# Patient Record
Sex: Male | Born: 1950 | Race: White | Hispanic: No | Marital: Married | Smoking: Never smoker
Health system: Southern US, Community
[De-identification: ages and names within clinical notes are randomized; demographics above are authoritative.]

## PROBLEM LIST (undated history)

## (undated) DIAGNOSIS — J029 Acute pharyngitis, unspecified: Secondary | ICD-10-CM

## (undated) DIAGNOSIS — M2550 Pain in unspecified joint: Secondary | ICD-10-CM

## (undated) DIAGNOSIS — R509 Fever, unspecified: Secondary | ICD-10-CM

## (undated) DIAGNOSIS — R51 Headache: Secondary | ICD-10-CM

## (undated) DIAGNOSIS — M25562 Pain in left knee: Secondary | ICD-10-CM

## (undated) DIAGNOSIS — D72819 Decreased white blood cell count, unspecified: Secondary | ICD-10-CM

## (undated) DIAGNOSIS — M25561 Pain in right knee: Secondary | ICD-10-CM

## (undated) DIAGNOSIS — R740 Nonspecific elevation of levels of transaminase and lactic acid dehydrogenase [LDH]: Secondary | ICD-10-CM

## (undated) HISTORY — DX: Pain in left knee: M25.562

## (undated) HISTORY — DX: Acute pharyngitis, unspecified: J02.9

## (undated) HISTORY — DX: Decreased white blood cell count, unspecified: D72.819

## (undated) HISTORY — DX: Nonspecific elevation of levels of transaminase and lactic acid dehydrogenase (ldh): R74.0

## (undated) HISTORY — DX: Fever, unspecified: R50.9

## (undated) HISTORY — DX: Pain in unspecified joint: M25.50

## (undated) HISTORY — DX: Headache: R51

## (undated) HISTORY — DX: Pain in right knee: M25.561

---

## 1999-02-23 ENCOUNTER — Ambulatory Visit (HOSPITAL_BASED_OUTPATIENT_CLINIC_OR_DEPARTMENT_OTHER): Admission: RE | Admit: 1999-02-23 | Discharge: 1999-02-23 | Payer: Self-pay | Admitting: Surgery

## 2002-02-13 ENCOUNTER — Ambulatory Visit (HOSPITAL_COMMUNITY): Admission: RE | Admit: 2002-02-13 | Discharge: 2002-02-13 | Payer: Self-pay | Admitting: Gastroenterology

## 2011-03-08 DIAGNOSIS — IMO0002 Reserved for concepts with insufficient information to code with codable children: Secondary | ICD-10-CM | POA: Insufficient documentation

## 2011-03-08 DIAGNOSIS — H40009 Preglaucoma, unspecified, unspecified eye: Secondary | ICD-10-CM | POA: Insufficient documentation

## 2011-08-20 DIAGNOSIS — B1910 Unspecified viral hepatitis B without hepatic coma: Secondary | ICD-10-CM | POA: Insufficient documentation

## 2012-11-26 ENCOUNTER — Encounter (INDEPENDENT_AMBULATORY_CARE_PROVIDER_SITE_OTHER): Payer: Self-pay | Admitting: General Surgery

## 2018-01-23 DIAGNOSIS — H2513 Age-related nuclear cataract, bilateral: Secondary | ICD-10-CM | POA: Diagnosis not present

## 2018-01-23 DIAGNOSIS — H40013 Open angle with borderline findings, low risk, bilateral: Secondary | ICD-10-CM | POA: Diagnosis not present

## 2018-01-30 DIAGNOSIS — R002 Palpitations: Secondary | ICD-10-CM | POA: Diagnosis not present

## 2018-01-30 DIAGNOSIS — Z23 Encounter for immunization: Secondary | ICD-10-CM | POA: Diagnosis not present

## 2018-01-30 DIAGNOSIS — Z0000 Encounter for general adult medical examination without abnormal findings: Secondary | ICD-10-CM | POA: Diagnosis not present

## 2018-01-30 DIAGNOSIS — D696 Thrombocytopenia, unspecified: Secondary | ICD-10-CM | POA: Diagnosis not present

## 2018-04-14 DIAGNOSIS — R509 Fever, unspecified: Secondary | ICD-10-CM | POA: Diagnosis not present

## 2018-04-21 DIAGNOSIS — R509 Fever, unspecified: Secondary | ICD-10-CM | POA: Diagnosis not present

## 2018-04-24 DIAGNOSIS — R51 Headache: Secondary | ICD-10-CM | POA: Diagnosis not present

## 2018-04-24 DIAGNOSIS — R509 Fever, unspecified: Secondary | ICD-10-CM | POA: Diagnosis not present

## 2018-04-28 DIAGNOSIS — R509 Fever, unspecified: Secondary | ICD-10-CM | POA: Diagnosis not present

## 2018-04-28 DIAGNOSIS — R51 Headache: Secondary | ICD-10-CM | POA: Diagnosis not present

## 2018-04-30 ENCOUNTER — Ambulatory Visit
Admission: RE | Admit: 2018-04-30 | Discharge: 2018-04-30 | Disposition: A | Discharge status: Home / Self Care | Payer: Medicare Other | Source: Ambulatory Visit | Attending: Internal Medicine | Admitting: Internal Medicine

## 2018-04-30 ENCOUNTER — Other Ambulatory Visit: Payer: Self-pay | Admitting: Internal Medicine

## 2018-04-30 DIAGNOSIS — D72819 Decreased white blood cell count, unspecified: Secondary | ICD-10-CM

## 2018-04-30 DIAGNOSIS — R509 Fever, unspecified: Secondary | ICD-10-CM | POA: Diagnosis not present

## 2018-04-30 DIAGNOSIS — D696 Thrombocytopenia, unspecified: Secondary | ICD-10-CM | POA: Diagnosis not present

## 2018-04-30 DIAGNOSIS — M2550 Pain in unspecified joint: Secondary | ICD-10-CM | POA: Diagnosis not present

## 2018-04-30 DIAGNOSIS — R740 Nonspecific elevation of levels of transaminase and lactic acid dehydrogenase [LDH]: Secondary | ICD-10-CM | POA: Diagnosis not present

## 2018-05-01 NOTE — Progress Notes (Signed)
  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 Robert Gates at [facility] for evaluation and management of Leukopenia. The pt reports that he is doing well overall.  The pt reports that 3 weeks ago he developed a gradual onset of fevers, significant headaches, malaise, fatigue, body aches, joint pains mostly in wrists and knees but denies redness. He has had muscular pains. Headaches described with pressure in occipital and temporal with throbbing and associated with onset of fevers, denies neck stiffness. He notes that these symptoms worsened over the first two weeks,and began Doxycycline with his PCP Dr. Gates. His fevers peaked at 102, and sustained for at least 5-6 days.   He notes that he began feeling better about one week ago, endorsing feeling gradually a little better each day this week. His temperature has been 99.1 the last 3 nights, no Tylenol, denies night sweats. Fatigue and muscle soreness/weakness is improving. The pt endorses feeling he is 90% back to his baseline. Joint pains described as more persistent with wrist tenderness, denies knee swelling. The pt denies diarrhea. Had nausea when he felt he was at his worst, with vomiting the morning he began Doxycycline. Denies lung or upper respiratory symptoms, endorses injected throat with vesicles which have improved. Seasonal nasal congestion with blood in morning, clears as day progresses. The pt notes that his throat has improved and is no longer injected.  The pt had been previously been golfing and denies any concern for abnormal insect bites or rashes. 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 Denies  international travel in the last 6 months. The pt denies pain along the spine or neck pain. The pt notes that he has lost about 5 pounds over the last couple weeks and had a low appetite, which is now improving.  The pt notes that his PLT were seen to be in low 100ks about 4-5 years ago, and have been persistently low. He notes his PLT last year were about 80k.   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 Notes that his liver tests have been normal for several years, has not had PCR testing.  Most recent lab results (04/30/18) of CBC w/diff is as follows: all values are WNL except for WBC at 1.4k, HCT at 38.3, PLT at 63k, ANC at 900, Lymphs at 600, Monocytes at 100. 04/30/18 LDH at 423. LDH at 368 on 04/25/18. 04/28/18 Sed Rate at 17. 04/26/18 SARS-Co-V-x 2 Negative 04/25/18 Hepatic Panel revealed all values WNL except for ALT at 71, AST at 67 EBV IgM neg, CMV IgM neg  On review of systems, pt reports improving fevers, improving headaches, resolved nausea, resolved throat soreness, slowly improving joint pains, knee tenderness, and denies changes in vision, cough, skin rashes, night sweats, noticing any new lumps or bumps, pain along the spine, abdominal pains, lower abdominal pains, leg swelling, testicular pain or swelling, neck pain, diarrhea, and any other symptoms.   On PMHx the pt reports mixed hyperlipidemia, Hep B. 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 On Family Hx the pt reports sister with CML, denies other bleeding, clotting,   or blood disorders.  MEDICAL HISTORY:  Past Medical History:  Diagnosis Date  . Arthralgia of both knees 05/06/2018  . Arthritic-like pain 05/06/2018  . FUO (fever of unknown origin) 05/06/2018  . Headache 05/06/2018  . Leukopenia 05/06/2018  . Pharyngitis 05/06/2018  . Transaminitis 05/06/2018  . Transaminitis 05/06/2018  . Patient Active Problem List   Diagnosis Date  Noted  . FUO (fever of unknown origin) 05/06/2018  . Elevated LDH 05/06/2018  . Transaminitis 05/06/2018  . Leukopenia 05/06/2018  . Headache 05/06/2018  . Arthritic-like pain 05/06/2018  . Arthralgia of both knees 05/06/2018  . Pharyngitis 05/06/2018  . Hepatitis B 08/20/2011  . Glaucoma suspect 03/08/2011  . Hypertropia 03/08/2011     SURGICAL HISTORY: No past surgical history on file.  SOCIAL HISTORY: Social History   Socioeconomic History  . Marital status: Married    Spouse name: Not on file  . Number of children: Not on file  . Years of education: Not on file  . Highest education level: Not on file  Occupational History  . Not on file  Social Needs  . Financial resource strain: Not on file  . Food insecurity:    Worry: Not on file    Inability: Not on file  . Transportation needs:    Medical: Not on file    Non-medical: Not on file  Tobacco Use  . Smoking status: Never Smoker  . Smokeless tobacco: Never Used  Substance and Sexual Activity  . Alcohol use: Yes    Comment: occ  . Drug use: Never  . Sexual activity: Not on file  Lifestyle  . Physical activity:    Days per week: Not on file    Minutes per session: Not on file  . Stress: Not on file  Relationships  . Social connections:    Talks on phone: Not on file    Gets together: Not on file    Attends religious service: Not on file    Active member of club or organization: Not on file    Attends meetings of clubs or organizations: Not on file    Relationship status: Not on file  . Intimate partner violence:    Fear of current or ex partner: Not on file    Emotionally abused: Not on file    Physically abused: Not on file    Forced sexual activity: Not on file  Other Topics Concern  . Not on file  Social History Narrative  . Not on file    FAMILY HISTORY: No family history on file.  ALLERGIES:  has no allergies on file.  MEDICATIONS:  Current Outpatient Medications  Medication Sig  Dispense Refill  . acetaminophen (TYLENOL) 160 MG/5ML elixir Take 15 mg/kg by mouth every 4 (four) hours as needed for fever.    . traMADol (ULTRAM) 50 MG tablet      No current facility-administered medications for this visit.     REVIEW OF SYSTEMS:    10 Point review of Systems was done is negative except as noted above.  PHYSICAL EXAMINATION:  . Vitals:   05/02/18 1231  BP: 112/73  Pulse: 84  Resp: 18  Temp: 98 F (36.7 C)  SpO2: 98%   Filed Weights   05/02/18 1231  Weight: 165 lb 6.4 oz (75 kg)   .Body mass index is 21.24 kg/m.  GENERAL:alert, in no acute distress and comfortable SKIN: no acute rashes, no significant lesions EYES: conjunctiva are pink and non-injected, sclera anicteric OROPHARYNX: (+)  One small pustule and some pharyngeal congestion without exudates. NECK: supple, no JVD LYMPH:  no palpable lymphadenopathy in the cervical, axillary or inguinal regions LUNGS: clear to auscultation b/l with normal respiratory effort HEART: regular rate & rhythm ABDOMEN:  normoactive bowel sounds , non tender, not distended. Extremity: no pedal edema PSYCH: alert & oriented x 3 with fluent speech NEURO: no focal motor/sensory deficits  LABORATORY DATA:  I have reviewed the data as listed Component     Latest Ref Rng & Units 05/02/2018  WBC     4.0 - 10.5 K/uL 1.3 (L)  RBC     4.22 - 5.81 MIL/uL 4.16 (L)  Hemoglobin     13.0 - 17.0 g/dL 12.4 (L)  HCT     39.0 - 52.0 % 37.3 (L)  MCV     80.0 - 100.0 fL 89.7  MCH     26.0 - 34.0 pg 29.8  MCHC     30.0 - 36.0 g/dL 33.2  RDW     11.5 - 15.5 % 13.6  Platelets     150 - 400 K/uL 89 (L)  nRBC     0.0 - 0.2 % 0.0  Neutrophils     % 64  NEUT#     1.7 - 7.7 K/uL 0.8 (L)  Lymphocytes     % 29  Lymphocyte #     0.7 - 4.0 K/uL 0.4 (L)  Monocytes Relative     % 7  Monocyte #     0.1 - 1.0 K/uL 0.1  Eosinophil     % 0  Eosinophils Absolute     0.0 - 0.5 K/uL 0.0  Basophil     % 0  Basophils  Absolute     0.0 - 0.1 K/uL 0.0  WBC Morphology      FEW VARIANT LYMPHS, RARE PLASMACYTOID  Immature Granulocytes     % 0  Abs Immature Granulocytes     0.00 - 0.07 K/uL 0.00  Rouleaux      PRESENT  Sodium     135 - 145 mmol/L 138  Potassium     3.5 - 5.1 mmol/L 4.1  Chloride     98 - 111 mmol/L 102  CO2     22 - 32 mmol/L 27  Glucose     70 - 99 mg/dL 96  BUN     8 - 23 mg/dL 16  Creatinine     0.61 - 1.24 mg/dL 0.95  Calcium     8.9 - 10.3 mg/dL 8.6 (L)  Total Protein     6.5 - 8.1 g/dL 6.8  Albumin     3.5 - 5.0 g/dL 3.3 (L)  AST     15 - 41 U/L 45 (H)  ALT     0 - 44 U/L 48 (H)  Alkaline Phosphatase     38 - 126 U/L 70  Total Bilirubin     0.3 - 1.2 mg/dL 0.4  GFR, Est Non African American     >60 mL/min >60  GFR, Est African American     >60 mL/min >60  Anion gap     5 - 15 9  IgG (Immunoglobin G), Serum     603 - 1,613 mg/dL 1,077  IgA     61 - 437 mg/dL 268  IgM (Immunoglobulin M), Srm     20 - 172 mg/dL 208 (H)  Total Protein ELP     6.0 - 8.5 g/dL 6.2    Albumin SerPl Elph-Mcnc     2.9 - 4.4 g/dL 3.3  Alpha 1     0.0 - 0.4 g/dL 0.3  Alpha2 Glob SerPl Elph-Mcnc     0.4 - 1.0 g/dL 0.7  B-Globulin SerPl Elph-Mcnc     0.7 - 1.3 g/dL 0.9  Gamma Glob SerPl Elph-Mcnc     0.4 - 1.8 g/dL 1.1  M Protein SerPl Elph-Mcnc     Not Observed g/dL Not Observed  Globulin, Total     2.2 - 3.9 g/dL 2.9  Albumin/Glob SerPl     0.7 - 1.7 1.2  IFE 1      Comment  Please Note (HCV):      Comment  Retic Ct Pct     0.4 - 3.1 % 0.7  RBC.     4.22 - 5.81 MIL/uL 4.07 (L)  Retic Count, Absolute     19.0 - 186.0 K/uL 28.1  Immature Retic Fract     2.3 - 15.9 % 9.4  HBV DNA SERPL PCR-ACNC     IU/mL HBV DNA not detected  HBV DNA SERPL PCR-LOG IU     log10 IU/mL UNABLE TO CALCULATE  Test Info:      Comment  Prothrombin Time     11.4 - 15.2 seconds 12.6  INR     0.8 - 1.2 1.0  APTT     24 - 36 seconds 29  Vitamin B12     180 - 914 pg/mL 504  Hep B  Core Total Ab     Negative Positive (A)  Hepatitis B Surface Ag     Negative Negative  Parovirus B19 IgM Abs     0.0 - 0.8 index 0.8  Immature Platelet Fraction     1.2 - 8.6 % 5.9  LDH     98 - 192 U/L 408 (H)    RADIOGRAPHIC STUDIES: I have personally reviewed the radiological images as listed and agreed with the findings in the report. Dg Chest 2 View  Result Date: 04/30/2018 CLINICAL DATA:  67-year-old male with a history of no chest complaints. History of leukopenia EXAM: CHEST - 2 VIEW COMPARISON:  None. FINDINGS: Cardiomediastinal silhouette within normal limits. Partially calcified lymph nodes of the right hilum. No pneumothorax or pleural effusion. No confluent airspace disease. No displaced fracture. IMPRESSION: Negative for acute cardiopulmonary disease. Calcified lymph nodes of the right hilum, potentially representing prior granulomatous disease Electronically Signed   By: Jaime  Wagner D.O.   On: 04/30/2018 14:57    ASSESSMENT & PLAN:  67 y.o. male with  1. Leukopenia - lymphopenia with neutropenia (ANC 800) 2. Thrombocytopenia some chronic element to this. Baseline 90-100k had dropped to 63k. Improved to 89k on labs today.  PLAN: -Discussed patient's most recent labs from 04/30/18, WBC low at 1.4k with ANC at 900, Lymphs at 600 and Monocytes at 100, no eosinophilia and no basophilia. HGB normal at 13.2 with normal MCV. PLT also low at 63k. LDH elevated at 423 and Sed Rate at 17. 04/25/18 Hepatic panel revealed mildly elevated transaminases ALT at 71 and AST at 67, improving.  -04/26/18 SARS-Co-V-2 Negative. CMV IgM negative. Tick born illnesses negative. Hep C negative. Acute EBV negative. -Mild thrombocytopenia for a couple years, in the 100ks -Reviewed the 04/30/18 CXR which revealed "Negative for acute cardiopulmonary disease. Calcified lymph nodes of the right hilum, potentially representing prior granulomatous disease." -Discussed that my primary concern is for a viral  infection based on the timeline of symptom   onset and improvement of the patient's symptoms.  -PBS - some atypical lymphocytes likely reactive. No blasts.  -Discussed that since his fevers and other symptoms have improved 90% and labs are improving will monitor interval labs in 2 labs. -Elevated transaminitis could be cause of elevated LDH -Based on low level of suspicion for a primary bone marrow disorder, I discussed that I would not strongly recommend completing a BM biopsy at this time, which the pt is comfortable with. -Will order labs today -labs in 2 weeks -if persistent/recurrent symptoms or worsening cytopenias will need CT chest/abd/pelvis and Bone marrow biopsy.  Labs today RTC with Dr Irene Limbo based on labs   All of the patients questions were answered with apparent satisfaction. The patient knows to call the clinic with any problems, questions or concerns.  The total time spent in the appt was 45 minutes and more than 50% was on counseling and direct patient cares.    Sullivan Lone MD MS AAHIVMS [facility] Hematology/Oncology Physician [facility]  (Office):       [phone] (Work cell):  [phone] (Fax):           [phone]  05/02/2018 1:25 PM  I, Baldwin Jamaica, am acting as a scribe for Dr. Sullivan Lone.   .I have reviewed the above documentation for accuracy and completeness, and I agree with the above. Brunetta Genera MD

## 2018-05-02 ENCOUNTER — Inpatient Hospital Stay: Payer: Medicare Other

## 2018-05-02 ENCOUNTER — Inpatient Hospital Stay: Payer: Medicare Other | Attending: Hematology | Admitting: Hematology

## 2018-05-02 ENCOUNTER — Telehealth: Payer: Self-pay | Admitting: Hematology

## 2018-05-02 ENCOUNTER — Other Ambulatory Visit: Payer: Self-pay

## 2018-05-02 VITALS — BP 112/73 | HR 84 | Temp 98.0°F | Resp 18 | Ht 74.0 in | Wt 165.4 lb

## 2018-05-02 DIAGNOSIS — R040 Epistaxis: Secondary | ICD-10-CM

## 2018-05-02 DIAGNOSIS — R7989 Other specified abnormal findings of blood chemistry: Secondary | ICD-10-CM

## 2018-05-02 DIAGNOSIS — R740 Nonspecific elevation of levels of transaminase and lactic acid dehydrogenase [LDH]: Secondary | ICD-10-CM | POA: Diagnosis not present

## 2018-05-02 DIAGNOSIS — D72819 Decreased white blood cell count, unspecified: Secondary | ICD-10-CM | POA: Insufficient documentation

## 2018-05-02 DIAGNOSIS — R509 Fever, unspecified: Secondary | ICD-10-CM

## 2018-05-02 DIAGNOSIS — D696 Thrombocytopenia, unspecified: Secondary | ICD-10-CM

## 2018-05-02 DIAGNOSIS — R748 Abnormal levels of other serum enzymes: Secondary | ICD-10-CM | POA: Diagnosis not present

## 2018-05-02 DIAGNOSIS — R945 Abnormal results of liver function studies: Secondary | ICD-10-CM

## 2018-05-02 LAB — CBC WITH DIFFERENTIAL/PLATELET
Abs Immature Granulocytes: 0 10*3/uL (ref 0.00–0.07)
Basophils Absolute: 0 10*3/uL (ref 0.0–0.1)
Basophils Relative: 0 %
Eosinophils Absolute: 0 10*3/uL (ref 0.0–0.5)
Eosinophils Relative: 0 %
HCT: 37.3 % — ABNORMAL LOW (ref 39.0–52.0)
Hemoglobin: 12.4 g/dL — ABNORMAL LOW (ref 13.0–17.0)
Immature Granulocytes: 0 %
Lymphocytes Relative: 29 %
Lymphs Abs: 0.4 10*3/uL — ABNORMAL LOW (ref 0.7–4.0)
MCH: 29.8 pg (ref 26.0–34.0)
MCHC: 33.2 g/dL (ref 30.0–36.0)
MCV: 89.7 fL (ref 80.0–100.0)
Monocytes Absolute: 0.1 10*3/uL (ref 0.1–1.0)
Monocytes Relative: 7 %
Neutro Abs: 0.8 10*3/uL — ABNORMAL LOW (ref 1.7–7.7)
Neutrophils Relative %: 64 %
Platelets: 89 10*3/uL — ABNORMAL LOW (ref 150–400)
RBC: 4.16 MIL/uL — ABNORMAL LOW (ref 4.22–5.81)
RDW: 13.6 % (ref 11.5–15.5)
WBC: 1.3 10*3/uL — ABNORMAL LOW (ref 4.0–10.5)
nRBC: 0 % (ref 0.0–0.2)

## 2018-05-02 LAB — CMP (CANCER CENTER ONLY)
ALT: 48 U/L — ABNORMAL HIGH (ref 0–44)
AST: 45 U/L — ABNORMAL HIGH (ref 15–41)
Albumin: 3.3 g/dL — ABNORMAL LOW (ref 3.5–5.0)
Alkaline Phosphatase: 70 U/L (ref 38–126)
Anion gap: 9 (ref 5–15)
BUN: 16 mg/dL (ref 8–23)
CO2: 27 mmol/L (ref 22–32)
Calcium: 8.6 mg/dL — ABNORMAL LOW (ref 8.9–10.3)
Chloride: 102 mmol/L (ref 98–111)
Creatinine: 0.95 mg/dL (ref 0.61–1.24)
GFR, Est AFR Am: >60 mL/min (ref >60)
GFR, Estimated: >60 mL/min (ref >60)
Glucose, Bld: 96 mg/dL (ref 70–99)
Potassium: 4.1 mmol/L (ref 3.5–5.1)
Sodium: 138 mmol/L (ref 135–145)
Total Bilirubin: 0.4 mg/dL (ref 0.3–1.2)
Total Protein: 6.8 g/dL (ref 6.5–8.1)

## 2018-05-02 LAB — LACTATE DEHYDROGENASE: LDH: 408 U/L — ABNORMAL HIGH (ref 98–192)

## 2018-05-02 LAB — APTT: aPTT: 29 seconds (ref 24–36)

## 2018-05-02 LAB — RETICULOCYTES
Immature Retic Fract: 9.4 % (ref 2.3–15.9)
RBC.: 4.07 MIL/uL — ABNORMAL LOW (ref 4.22–5.81)
Retic Count, Absolute: 28.1 10*3/uL (ref 19.0–186.0)
Retic Ct Pct: 0.7 % (ref 0.4–3.1)

## 2018-05-02 LAB — PROTIME-INR
INR: 1 (ref 0.8–1.2)
Prothrombin Time: 12.6 seconds (ref 11.4–15.2)

## 2018-05-02 LAB — IMMATURE PLATELET FRACTION: Immature Platelet Fraction: 5.9 % (ref 1.2–8.6)

## 2018-05-02 LAB — VITAMIN B12: Vitamin B-12: 504 pg/mL (ref 180–914)

## 2018-05-02 NOTE — Telephone Encounter (Signed)
Per 2/24 los RTC with Dr Irene Limbo based on labs.

## 2018-05-02 NOTE — Telephone Encounter (Signed)
Called patient to inform patient of lab appt.  Patient aware of appt date and time.

## 2018-05-03 LAB — HEPATITIS B DNA, ULTRAQUANTITATIVE, PCR
HBV DNA SERPL PCR-ACNC: NOT DETECTED IU/mL
HBV DNA SERPL PCR-LOG IU: UNDETERMINED log10 IU/mL

## 2018-05-03 LAB — HEPATITIS B CORE ANTIBODY, TOTAL: Hep B Core Total Ab: POSITIVE — AB

## 2018-05-03 LAB — HEPATITIS B SURFACE ANTIGEN: Hepatitis B Surface Ag: NEGATIVE

## 2018-05-05 ENCOUNTER — Telehealth: Payer: Self-pay | Admitting: Infectious Disease

## 2018-05-05 LAB — MULTIPLE MYELOMA PANEL, SERUM
Albumin SerPl Elph-Mcnc: 3.3 g/dL (ref 2.9–4.4)
Albumin/Glob SerPl: 1.2 (ref 0.7–1.7)
Alpha 1: 0.3 g/dL (ref 0.0–0.4)
Alpha2 Glob SerPl Elph-Mcnc: 0.7 g/dL (ref 0.4–1.0)
B-Globulin SerPl Elph-Mcnc: 0.9 g/dL (ref 0.7–1.3)
Gamma Glob SerPl Elph-Mcnc: 1.1 g/dL (ref 0.4–1.8)
Globulin, Total: 2.9 g/dL (ref 2.2–3.9)
IgA: 268 mg/dL (ref 61–437)
IgG (Immunoglobin G), Serum: 1077 mg/dL (ref 603–1613)
IgM (Immunoglobulin M), Srm: 208 mg/dL — ABNORMAL HIGH (ref 20–172)
Total Protein ELP: 6.2 g/dL (ref 6.0–8.5)

## 2018-05-05 NOTE — Telephone Encounter (Signed)
;  jk;k

## 2018-05-05 NOTE — Telephone Encounter (Addendum)
Contacted by my partner Dr. Baxter Flattery, who had been called by Dr. Excell Seltzer primary care physician Dr. Inda Merlin.  Dr. Baxter Flattery was concerned about need to further work-up or rule out novel coronavirus 2019  I therefore contacted Dr. Excell Seltzer and discussed his recent and ongoing illness over the phone.  Is a more detailed note from his oncologist Dr. Irene Limbo int he computer   What I gathered he began to feel ill  Starting 3 weeks ago patient began having low grade temperatures, malaise, HA.  Nasal drainage that was bloody initially.  When I talked to him over the phone he did not relay the joint pains at that time though it is documented as such in hematology's note.  Monday April 6th tested with [facility] for CoVID PCR.  Came back in 24 hours was negative  Continued to feel poorly 100.5, muscle aches, swelling in joints and hands with discomfort  Repeat CoVID test negative at [facility] later that week  Following week April 13th he felt worse Higher fever to 102, chills, body aches, joint pain, severe headaches  No resp symptoms  Saw Mertha Finders with lab work showing WBC 1.6, hgb normal, Platelets were 50 K (90-100 range normally for him)  LFTs up, LDH elevated 300 range  Doxycycline 100mg  BID x 10 days  States that he felt slightly better while on the doxycycline  Recheck lab work in week WBC lower, LDH higher  Seen by [employer] at [facility]   By the time he saw Hematologist was feeling better.  White blood cell count was still depressed platelets coming back to closer to his baseline  Hepatitis B surface antigen negative and hepatitis B DNA negative.    Still fatigue and joint pain.   Over weekend felt better  Now his fever going up again to 101.6 maximum temperature  Joint aches.   Bilateral wrists seem mildy swollen not with clear cut effusions.  Dry cough has come in over weekend  HA are much better.  Not taking tylenol.  No signicant travel. No AI history.  Sister has CML that is not on treatment.   He has mainly travelled to [place], [place]. Has travelled to [place] other than [place] Grew up in [place] of other data shows a chest x-ray recently with some calcified granulomas.  I reviewed the case with my partner Dr. Baxter Flattery we fell that he is low cleared for having novel coronavirus 2019.  Therefore were not going to try to pursue acquiring a serology test to look for infection and Dr. Excell Seltzer.  Rather suspicion is higher for autoimmune disease such as stills disease, sarcoidosis, during lymphoma, occult infection including endocarditis.  We will have Dr. Excell Seltzer come to see me in clinic tomorrow at 3 PM I will recheck to my clinic staff to create an appointment slot as I do not have ones in the more afternoon tomorrow.

## 2018-05-06 ENCOUNTER — Other Ambulatory Visit: Payer: Self-pay

## 2018-05-06 ENCOUNTER — Encounter: Payer: Self-pay | Admitting: Infectious Disease

## 2018-05-06 ENCOUNTER — Telehealth: Payer: Self-pay | Admitting: *Deleted

## 2018-05-06 ENCOUNTER — Ambulatory Visit (INDEPENDENT_AMBULATORY_CARE_PROVIDER_SITE_OTHER): Payer: Medicare Other | Admitting: Infectious Disease

## 2018-05-06 VITALS — BP 106/69 | HR 94 | Temp 98.9°F | Wt 166.0 lb

## 2018-05-06 DIAGNOSIS — M25562 Pain in left knee: Secondary | ICD-10-CM | POA: Insufficient documentation

## 2018-05-06 DIAGNOSIS — G4489 Other headache syndrome: Secondary | ICD-10-CM

## 2018-05-06 DIAGNOSIS — M25541 Pain in joints of right hand: Secondary | ICD-10-CM | POA: Diagnosis not present

## 2018-05-06 DIAGNOSIS — M25561 Pain in right knee: Secondary | ICD-10-CM | POA: Diagnosis not present

## 2018-05-06 DIAGNOSIS — R059 Cough, unspecified: Secondary | ICD-10-CM

## 2018-05-06 DIAGNOSIS — J029 Acute pharyngitis, unspecified: Secondary | ICD-10-CM

## 2018-05-06 DIAGNOSIS — M25542 Pain in joints of left hand: Secondary | ICD-10-CM

## 2018-05-06 DIAGNOSIS — R51 Headache: Secondary | ICD-10-CM

## 2018-05-06 DIAGNOSIS — R509 Fever, unspecified: Secondary | ICD-10-CM

## 2018-05-06 DIAGNOSIS — R7401 Elevation of levels of liver transaminase levels: Secondary | ICD-10-CM

## 2018-05-06 DIAGNOSIS — D708 Other neutropenia: Secondary | ICD-10-CM | POA: Diagnosis not present

## 2018-05-06 DIAGNOSIS — D72819 Decreased white blood cell count, unspecified: Secondary | ICD-10-CM

## 2018-05-06 DIAGNOSIS — R05 Cough: Secondary | ICD-10-CM | POA: Diagnosis not present

## 2018-05-06 DIAGNOSIS — R740 Nonspecific elevation of levels of transaminase and lactic acid dehydrogenase [LDH]: Secondary | ICD-10-CM | POA: Diagnosis not present

## 2018-05-06 DIAGNOSIS — R7402 Elevation of levels of lactic acid dehydrogenase (LDH): Secondary | ICD-10-CM

## 2018-05-06 DIAGNOSIS — Z114 Encounter for screening for human immunodeficiency virus [HIV]: Secondary | ICD-10-CM

## 2018-05-06 DIAGNOSIS — R519 Headache, unspecified: Secondary | ICD-10-CM

## 2018-05-06 DIAGNOSIS — M2550 Pain in unspecified joint: Secondary | ICD-10-CM

## 2018-05-06 HISTORY — DX: Headache, unspecified: R51.9

## 2018-05-06 HISTORY — DX: Acute pharyngitis, unspecified: J02.9

## 2018-05-06 HISTORY — DX: Elevation of levels of liver transaminase levels: R74.01

## 2018-05-06 HISTORY — DX: Fever, unspecified: R50.9

## 2018-05-06 HISTORY — DX: Pain in right knee: M25.561

## 2018-05-06 HISTORY — DX: Pain in left knee: M25.562

## 2018-05-06 HISTORY — DX: Pain in unspecified joint: M25.50

## 2018-05-06 HISTORY — DX: Decreased white blood cell count, unspecified: D72.819

## 2018-05-06 LAB — PARVOVIRUS B19 IGM: Parovirus B19 IgM Abs: 0.8 index (ref 0.0–0.8)

## 2018-05-06 NOTE — Telephone Encounter (Signed)
Called Dr. Excell Seltzer, he is scheduled for an in office visit with Dr. Tommy Medal today 05/06/2018.  Informed him his appointment is at 3:00pm. Pricilla Riffle RN

## 2018-05-06 NOTE — Telephone Encounter (Signed)
Patient called w/update for Dr. Irene Limbo, left voice mail: Has had recurrent high fevers last few nights. Feeling worse. Has been referred to Dr. Tommy Medal in Infectious Disease - seeing him this afternoon. Was told he will order CT scan.  Information given to Dr. Irene Limbo.

## 2018-05-06 NOTE — Telephone Encounter (Signed)
Perfect

## 2018-05-06 NOTE — Progress Notes (Signed)
Subjective:    Patient ID: Anthony Stephens, male    DOB: 1950-12-07, 68 y.o.   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  He had been in his usual state of health until the weekend of April 4 and.  Had begun developing low-grade fevers with malaise and intense headaches which worsened and were subtle and temporal areas with throbbing and associated with onset of fevers.  This very same time he developed sinus congestion and had bloody discharge.  He also had developed intense pains in his wrists bilaterally with reduced range of motion, and also intense pains in his knees bilaterally which made it difficult for him to climb stairs without supporting himself.  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  Continue to feel poorly however and was then retested later that week after having worsening muscle aches and swelling in his joints and hands and knees.  Repeat COVID test was again negative.  He had laboratory work done at his primary care physician's office which was pertinent for mild elevation of his transaminases along with an elevated LDH and a markedly suppressed white blood cell count which was at 1.6 thousand and reduced platelets to 50,000 below his normal range for him which is 90 200,000.  Legitimate concern for tickborne infection such as [facility] spotted fever he was initiated on doxycycline 100 mg twice daily for 10 days and states that he felt slightly better while on the doxycycline.  Further laboratory data obtained  at Dr. Inda Merlin office had shown him to have a negative RMSF IgM, negative Shelda Pal for Lyme, evidence of past infection with Epstein-Barr virus but not current infection with this virus.  CMV titers were negative.  It was normal.  Hepatitis C antibody was negative   He was then seen by Dr. Irene Limbo at the Cancer center.  His primary concern remained for a viral syndrome that was causing his constellation of symptoms.  Repeated labs which here showed normal chemistry with AST and ALT of 45 and 48 LDH up to 408, CBC showed count of 1300 with 800 neutrophils 40 lymphocytes and 100 monocytes had slight anemia with hemoglobin 12.4 that was normocytic and normochromic and platelets that are risen to 8 8 9000.  He had a chest x-ray performed which showed evidence of divide granulomas.  Over the weekend he had began to feel better and his headaches had improved dramatically.  His knee pain is still there when he climbs stairs but not nearly as debilitating as it had been.  His wrist continue to be tender and he feels warm to touch and has reduced dorsiflexion.  After the weekend and early in the week he again started feeling poorly with current fevers up to 101.6 and mentioned again joint pain in his wrist in particular and profound fatigue.  Also developed a dry cough and a bit of a sore throat which is at times had a slight exudate accompanying it.  He has not had any rashes he has had some mild nausea when his  fevers were bad.  His travel history he grew up in [place].  He has traveled to the [place] including [place] and [place].  He does not recall having any type of respiratory illness while in those areas.  He has no known exposure to tuberculosis.  He has not traveled to the [place] or to [place] or [place].  He is only travel to [place] and typically to [place].  He had no recent travel at all and no sick contacts in his household.  He does have a sister  who has CML that is not on treatment at this point time his mother had lung cancer but she was a known smoker.  Partner Dr. Graylon Good I reviewed his case and felt that he seemed an unlikely patient to have a coronavirus 2019 given that he had 2- PCR's when he had become acutely ill and did not have lower respiratory symptoms.  While I suppose is possible that the blood from his nasal discharge could have potentially compromise the PCR reaction I would have confidence in it.  We felt comfortable that he did not have this as is diagnosis explaining his symptoms and I brought him into clinic to work him up for fever of unknown origin.  Reviewed his radiographs personally which show no active pulmonary disease also reviewed his laboratory data both done here at the cancer center and at his primary care physician's office.  Past Medical History:  Diagnosis Date  . Arthralgia of both knees 05/06/2018  . Arthritic-like pain 05/06/2018  . FUO (fever of unknown origin) 05/06/2018  . Headache 05/06/2018  . Leukopenia 05/06/2018  . Pharyngitis 05/06/2018  . Transaminitis 05/06/2018  . Transaminitis 05/06/2018    History reviewed. No pertinent surgical history.  History reviewed. No pertinent family history.    Social History   Socioeconomic History  . Marital status: Married    Spouse name: Not on file  . Number of children: Not on file  . Years of education: Not on file  . Highest education level: Not on file  Occupational History  . Not on file  Social Needs  . Financial resource strain: Not on file  . Food insecurity:    Worry: Not on file    Inability: Not on file  . Transportation needs:    Medical: Not on file    Non-medical: Not on file  Tobacco Use  . Smoking status: Never Smoker  . Smokeless tobacco: Never Used  Substance and Sexual Activity  . Alcohol use: Yes    Comment: occ  . Drug use: Never  . Sexual activity: Not on file  Lifestyle  . Physical activity:    Days per  week: Not on file    Minutes per session: Not on file  . Stress: Not on file  Relationships  . Social connections:    Talks on phone: Not on file    Gets together: Not on file    Attends religious service: Not on file    Active member of club or organization: Not on file    Attends meetings of clubs or organizations: Not on file    Relationship status: Not on file  Other Topics Concern  . Not on file  Social History Narrative  . Not on file    Not on File   Current Outpatient Medications:  .  acetaminophen (TYLENOL) 160 MG/5ML elixir, Take 15 mg/kg by mouth every 4 (four) hours as needed for fever.,  Disp: , Rfl:  .  traMADol (ULTRAM) 50 MG tablet, , Disp: , Rfl:        Review of Systems  Constitutional: Positive for activity change, appetite change, chills, diaphoresis, fatigue and fever.  HENT: Positive for congestion, nosebleeds, rhinorrhea and sore throat.   Eyes: Negative for photophobia, pain, discharge, itching and visual disturbance.  Respiratory: Positive for cough. Negative for apnea, choking, chest tightness, shortness of breath and wheezing.   Cardiovascular: Negative for chest pain, palpitations and leg swelling.  Gastrointestinal: Positive for nausea. Negative for abdominal distention, abdominal pain, blood in stool, constipation, diarrhea and vomiting.  Genitourinary: Negative for dysuria, flank pain and hematuria.  Musculoskeletal: Positive for arthralgias, joint swelling and myalgias. Negative for back pain, neck pain and neck stiffness.  Skin: Negative for color change, pallor, rash and wound.  Neurological: Positive for dizziness, weakness and light-headedness. Negative for tremors, seizures, facial asymmetry, speech difficulty, numbness and headaches.  Hematological: Negative for adenopathy. Does not bruise/bleed easily.  Psychiatric/Behavioral: Negative for agitation, behavioral problems, confusion, dysphoric mood, self-injury and suicidal ideas. The  patient is not nervous/anxious.        Objective:   Physical Exam Vitals signs and nursing note reviewed.  Constitutional:      General: He is not in acute distress.    Appearance: Normal appearance. He is well-developed. He is not ill-appearing or diaphoretic.  HENT:     Head: Normocephalic and atraumatic.     Right Ear: Hearing and external ear normal.     Left Ear: Hearing and external ear normal.     Nose: No nasal deformity, congestion or rhinorrhea.     Mouth/Throat:     Mouth: Mucous membranes are moist.     Pharynx: Oropharyngeal exudate and posterior oropharyngeal erythema present.  Eyes:     General: No scleral icterus.       Left eye: No discharge.     Extraocular Movements: Extraocular movements intact.     Conjunctiva/sclera: Conjunctivae normal.     Right eye: Right conjunctiva is not injected.     Left eye: Left conjunctiva is not injected.     Pupils: Pupils are equal, round, and reactive to light.  Neck:     Musculoskeletal: Normal range of motion and neck supple. No neck rigidity or muscular tenderness.     Vascular: No JVD.  Cardiovascular:     Rate and Rhythm: Normal rate and regular rhythm.     Heart sounds: Normal heart sounds, S1 normal and S2 normal. No murmur. No friction rub. No gallop.   Pulmonary:     Effort: Pulmonary effort is normal. No respiratory distress.     Breath sounds: Normal breath sounds. No stridor. No wheezing, rhonchi or rales.  Abdominal:     General: Abdomen is flat. Bowel sounds are normal. There is no distension.     Palpations: Abdomen is soft. There is no hepatomegaly, splenomegaly or mass.     Tenderness: There is no abdominal tenderness. There is no guarding.     Hernia: No hernia is present.  Musculoskeletal: Normal range of motion.        General: No deformity or signs of injury.     Right shoulder: Normal.     Left shoulder: Normal.     Right hip: Normal.     Left hip: Normal.     Right knee: Normal.     Left knee:  Normal.     Right lower leg: No edema.  Left lower leg: No edema.  Lymphadenopathy:     Head:     Right side of head: No submental, submandibular, tonsillar, preauricular or posterior auricular adenopathy.     Left side of head: No submental, submandibular, tonsillar, preauricular or posterior auricular adenopathy.     Cervical: No cervical adenopathy.     Right cervical: No superficial or deep cervical adenopathy.    Left cervical: No superficial or deep cervical adenopathy.     Upper Body:     Right upper body: No supraclavicular adenopathy.     Left upper body: No supraclavicular adenopathy.  Skin:    General: Skin is warm and dry.     Coloration: Skin is not jaundiced or pale.     Findings: No abrasion, bruising, ecchymosis, erythema, lesion or rash.     Nails: There is no clubbing.   Neurological:     General: No focal deficit present.     Mental Status: He is alert and oriented to person, place, and time.     Sensory: No sensory deficit.     Coordination: Coordination normal.     Gait: Gait normal.  Psychiatric:        Attention and Perception: He is attentive.        Mood and Affect: Mood normal.        Speech: Speech normal.        Behavior: Behavior normal. Behavior is cooperative.        Thought Content: Thought content normal.        Judgment: Judgment normal.    Wrists : He does have pain in his wrists and some slight tenderness to palpation and reduced dorsiflexion due to the pain.  There is not an obvious effusion in either wrist.  Knees are without effusions and are nontender.      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   Certainly infection is not at all out of the question. I have some anxiety that he might have a smoldering endocarditis or other occult infection  We were sufficiently convinced he does not have CoVID 2019 though if we had the antibody tests available in past week I would have honored Dr. Lear Ng request to be tested for CoVID.  (as mentioned I feel the 2 PCR's should have been + early in his disease presentation where he DID have upper resp symptoms and no lower resp symptoms.  Repeating a CBC, conference of metabolic panel sed rate C-reactive protein,  Echo HIV antibody hepatitis C antibody and a antibody  We will check 2 blood cultures  Check a QuantiFERON gold and check a histoplasma antigen from urine  We will check a repeat LDH a CPK and ANA and anti-DNA, rheumatoid factor SSA/SSB, cryoglobulins and SPEP  We will get a CT chest abdomen pelvis and will try to get this done in the next day or 2.  I plan on seeing him back in 2 weeks time.  Certainly if the laboratory work and CT imaging does not point to a clear-cut diagnosis and his hematological abnormalities persist I would thoroughly agree with Dr.  Kale performing a bone marrow biopsy and in addition to sending for pathological examination send dedicated bone marrow for AFB stain and culture and fungal cultures.

## 2018-05-07 ENCOUNTER — Ambulatory Visit
Admission: RE | Admit: 2018-05-07 | Discharge: 2018-05-07 | Disposition: A | Discharge status: Home / Self Care | Payer: Medicare Other | Source: Ambulatory Visit | Attending: Infectious Disease | Admitting: Infectious Disease

## 2018-05-07 DIAGNOSIS — M25542 Pain in joints of left hand: Secondary | ICD-10-CM

## 2018-05-07 DIAGNOSIS — R059 Cough, unspecified: Secondary | ICD-10-CM

## 2018-05-07 DIAGNOSIS — R509 Fever, unspecified: Secondary | ICD-10-CM | POA: Diagnosis not present

## 2018-05-07 DIAGNOSIS — G4489 Other headache syndrome: Secondary | ICD-10-CM

## 2018-05-07 DIAGNOSIS — R911 Solitary pulmonary nodule: Secondary | ICD-10-CM | POA: Diagnosis not present

## 2018-05-07 DIAGNOSIS — D708 Other neutropenia: Secondary | ICD-10-CM

## 2018-05-07 DIAGNOSIS — M25561 Pain in right knee: Secondary | ICD-10-CM

## 2018-05-07 DIAGNOSIS — M25541 Pain in joints of right hand: Secondary | ICD-10-CM

## 2018-05-07 DIAGNOSIS — R740 Nonspecific elevation of levels of transaminase and lactic acid dehydrogenase [LDH]: Secondary | ICD-10-CM

## 2018-05-07 DIAGNOSIS — M25562 Pain in left knee: Secondary | ICD-10-CM

## 2018-05-07 DIAGNOSIS — Z114 Encounter for screening for human immunodeficiency virus [HIV]: Secondary | ICD-10-CM

## 2018-05-07 DIAGNOSIS — R05 Cough: Secondary | ICD-10-CM

## 2018-05-07 DIAGNOSIS — R7401 Elevation of levels of liver transaminase levels: Secondary | ICD-10-CM

## 2018-05-07 LAB — HEPATITIS C ANTIBODY
Hepatitis C Ab: NONREACTIVE
SIGNAL TO CUT-OFF: 0.01 (ref <1.00)

## 2018-05-07 LAB — LACTATE DEHYDROGENASE, ISOENZYMES
LDH 1: 14 % — ABNORMAL LOW (ref 17–32)
LDH 2: 33 % (ref 25–40)
LDH 3: 31 % — ABNORMAL HIGH (ref 17–27)
LDH 4: 15 % — ABNORMAL HIGH (ref 5–13)
LDH 5: 7 % (ref 4–20)
LDH Isoenzymes, Total: 392 IU/L — ABNORMAL HIGH (ref 121–224)

## 2018-05-07 LAB — C-REACTIVE PROTEIN: CRP: 9.9 mg/L — ABNORMAL HIGH (ref <8.0)

## 2018-05-07 LAB — HEPATITIS A ANTIBODY, TOTAL: Hepatitis A AB,Total: BORDERLINE — AB

## 2018-05-07 LAB — FERRITIN: Ferritin: 652 ng/mL — ABNORMAL HIGH (ref 24–380)

## 2018-05-07 LAB — SJOGRENS SYNDROME-B EXTRACTABLE NUCLEAR ANTIBODY: SSB (La) (ENA) Antibody, IgG: <1 AI

## 2018-05-07 LAB — SEDIMENTATION RATE

## 2018-05-07 LAB — MPO/PR-3 (ANCA) ANTIBODIES
Myeloperoxidase Abs: <1 AI
Serine Protease 3: <1 AI

## 2018-05-07 LAB — SJOGRENS SYNDROME-A EXTRACTABLE NUCLEAR ANTIBODY: SSA (Ro) (ENA) Antibody, IgG: <1 AI

## 2018-05-07 LAB — RHEUMATOID FACTOR: Rheumatoid fact SerPl-aCnc: <14 IU/mL (ref <14)

## 2018-05-07 LAB — ANA: Anti Nuclear Antibody (ANA): NEGATIVE

## 2018-05-07 LAB — CK: Total CK: 79 U/L (ref 44–196)

## 2018-05-07 LAB — CBC WITH DIFFERENTIAL/PLATELET

## 2018-05-07 LAB — HIV ANTIBODY (ROUTINE TESTING W REFLEX): HIV 1&2 Ab, 4th Generation: NONREACTIVE

## 2018-05-07 LAB — ANTI-DNA ANTIBODY, DOUBLE-STRANDED: ds DNA Ab: <1 IU/mL

## 2018-05-07 LAB — ANGIOTENSIN CONVERTING ENZYME: Angiotensin-Converting Enzyme: 49 U/L (ref 9–67)

## 2018-05-07 LAB — LACTATE DEHYDROGENASE: LDH: 448 U/L — ABNORMAL HIGH (ref 120–250)

## 2018-05-07 MED ORDER — IOPAMIDOL (ISOVUE-300) INJECTION 61%
100.0000 mL | Freq: Once | INTRAVENOUS | Status: AC | PRN
  Administered 2018-05-07: 100 mL via INTRAVENOUS

## 2018-05-08 ENCOUNTER — Telehealth: Payer: Self-pay

## 2018-05-08 ENCOUNTER — Other Ambulatory Visit: Payer: Self-pay | Admitting: Hematology

## 2018-05-08 DIAGNOSIS — D72819 Decreased white blood cell count, unspecified: Secondary | ICD-10-CM

## 2018-05-08 DIAGNOSIS — R509 Fever, unspecified: Secondary | ICD-10-CM

## 2018-05-08 DIAGNOSIS — D696 Thrombocytopenia, unspecified: Secondary | ICD-10-CM

## 2018-05-08 LAB — QUANTIFERON-TB GOLD PLUS
Mitogen-NIL: 1.55 IU/mL
NIL: 1.47 IU/mL
QuantiFERON-TB Gold Plus: NEGATIVE
TB1-NIL: <0 IU/mL
TB2-NIL: <0 IU/mL

## 2018-05-08 LAB — PROTEIN ELECTROPHORESIS, SERUM
Albumin ELP: 3.4 g/dL — ABNORMAL LOW (ref 3.8–4.8)
Alpha 1: 0.4 g/dL — ABNORMAL HIGH (ref 0.2–0.3)
Alpha 2: 0.7 g/dL (ref 0.5–0.9)
Beta 2: 0.3 g/dL (ref 0.2–0.5)
Beta Globulin: 0.3 g/dL — ABNORMAL LOW (ref 0.4–0.6)
Gamma Globulin: 1.2 g/dL (ref 0.8–1.7)

## 2018-05-08 NOTE — Telephone Encounter (Signed)
I discussed with Dr. Irene Limbo the hematologist this am and he did not feel strongly need to repeat CBC though if Dr. Excell Seltzer wants to we can.

## 2018-05-08 NOTE — Telephone Encounter (Signed)
Per Dr. Tommy Medal called patient regarding Sed rate/CBC lab that clotted. Dr. Lucianne Lei dam would like for patient to be made aware of this and see if he would like to do repeat blood work.  Was able to speak with patient to inform him that Sed Rate and CBC clotted. Patient states he would like for Dr. Tommy Medal and Hematologist to discuss and decide if both labs are worth repeating. Patient states he is fine not repeating. Anthony Stephens

## 2018-05-09 ENCOUNTER — Telehealth: Payer: Self-pay | Admitting: Hematology

## 2018-05-09 NOTE — Telephone Encounter (Signed)
Per 5/1 schedule message schedule patient for lab 5/6 @ 2pm. Time slot to for 2pm not available. Spoke with patient re 5/6 @ 1:15 pm.

## 2018-05-12 LAB — COMPLETE METABOLIC PANEL WITH GFR
AG Ratio: 1.1 (calc) (ref 1.0–2.5)
ALT: 28 U/L (ref 9–46)
AST: 43 U/L — ABNORMAL HIGH (ref 10–35)
Albumin: 3.4 g/dL — ABNORMAL LOW (ref 3.6–5.1)
Alkaline phosphatase (APISO): 63 U/L (ref 35–144)
BUN: 17 mg/dL (ref 7–25)
CO2: 26 mmol/L (ref 20–32)
Calcium: 8.6 mg/dL (ref 8.6–10.3)
Chloride: 100 mmol/L (ref 98–110)
Creat: 1 mg/dL (ref 0.70–1.25)
GFR, Est African American: 91 mL/min/{1.73_m2} (ref >=60)
GFR, Est Non African American: 79 mL/min/{1.73_m2} (ref >=60)
Globulin: 3 g/dL (calc) (ref 1.9–3.7)
Glucose, Bld: 99 mg/dL (ref 65–99)
Potassium: 4.7 mmol/L (ref 3.5–5.3)
Sodium: 134 mmol/L — ABNORMAL LOW (ref 135–146)
Total Bilirubin: 0.4 mg/dL (ref 0.2–1.2)
Total Protein: 6.4 g/dL (ref 6.1–8.1)

## 2018-05-12 LAB — HISTOPLASMA ANTIGEN, URINE: Histoplasma Antigen, urine: <0.2

## 2018-05-14 ENCOUNTER — Other Ambulatory Visit: Payer: Self-pay | Admitting: Radiology

## 2018-05-14 ENCOUNTER — Other Ambulatory Visit: Payer: Self-pay

## 2018-05-14 ENCOUNTER — Inpatient Hospital Stay: Payer: Medicare Other | Attending: Hematology

## 2018-05-14 DIAGNOSIS — D696 Thrombocytopenia, unspecified: Secondary | ICD-10-CM

## 2018-05-14 DIAGNOSIS — R509 Fever, unspecified: Secondary | ICD-10-CM

## 2018-05-14 DIAGNOSIS — D72819 Decreased white blood cell count, unspecified: Secondary | ICD-10-CM | POA: Diagnosis not present

## 2018-05-14 LAB — CBC WITH DIFFERENTIAL/PLATELET
Abs Immature Granulocytes: 0.02 10*3/uL (ref 0.00–0.07)
Basophils Absolute: 0 10*3/uL (ref 0.0–0.1)
Basophils Relative: 1 %
Eosinophils Absolute: 0 10*3/uL (ref 0.0–0.5)
Eosinophils Relative: 0 %
HCT: 31.6 % — ABNORMAL LOW (ref 39.0–52.0)
Hemoglobin: 10.7 g/dL — ABNORMAL LOW (ref 13.0–17.0)
Immature Granulocytes: 1 %
Lymphocytes Relative: 26 %
Lymphs Abs: 0.5 10*3/uL — ABNORMAL LOW (ref 0.7–4.0)
MCH: 29.9 pg (ref 26.0–34.0)
MCHC: 33.9 g/dL (ref 30.0–36.0)
MCV: 88.3 fL (ref 80.0–100.0)
Monocytes Absolute: 0 10*3/uL — ABNORMAL LOW (ref 0.1–1.0)
Monocytes Relative: 1 %
Neutro Abs: 1.4 10*3/uL — ABNORMAL LOW (ref 1.7–7.7)
Neutrophils Relative %: 71 %
Platelets: 44 10*3/uL — ABNORMAL LOW (ref 150–400)
RBC: 3.58 MIL/uL — ABNORMAL LOW (ref 4.22–5.81)
RDW: 14.1 % (ref 11.5–15.5)
WBC: 2 10*3/uL — ABNORMAL LOW (ref 4.0–10.5)
nRBC: 0 % (ref 0.0–0.2)

## 2018-05-14 LAB — CULTURE, BLOOD (SINGLE)
MICRO NUMBER:: 428472
MICRO NUMBER:: 428473

## 2018-05-14 LAB — RETICULOCYTES
Immature Retic Fract: 11.3 % (ref 2.3–15.9)
RBC.: 3.67 MIL/uL — ABNORMAL LOW (ref 4.22–5.81)
Retic Count, Absolute: 16.1 10*3/uL — ABNORMAL LOW (ref 19.0–186.0)
Retic Ct Pct: 0.4 % (ref 0.4–3.1)

## 2018-05-14 LAB — LACTATE DEHYDROGENASE: LDH: 1506 U/L — ABNORMAL HIGH (ref 98–192)

## 2018-05-14 LAB — DIRECT ANTIGLOBULIN TEST (NOT AT ARMC)
DAT, IgG: NEGATIVE
DAT, complement: NEGATIVE

## 2018-05-15 ENCOUNTER — Ambulatory Visit (HOSPITAL_COMMUNITY)
Admission: RE | Admit: 2018-05-15 | Discharge: 2018-05-15 | Disposition: A | Discharge status: Home / Self Care | Payer: Medicare Other | Source: Ambulatory Visit | Attending: Hematology | Admitting: Hematology

## 2018-05-15 ENCOUNTER — Encounter (HOSPITAL_COMMUNITY): Payer: Self-pay

## 2018-05-15 ENCOUNTER — Other Ambulatory Visit: Payer: Self-pay

## 2018-05-15 DIAGNOSIS — R509 Fever, unspecified: Secondary | ICD-10-CM | POA: Insufficient documentation

## 2018-05-15 DIAGNOSIS — D72819 Decreased white blood cell count, unspecified: Secondary | ICD-10-CM | POA: Diagnosis not present

## 2018-05-15 DIAGNOSIS — R5381 Other malaise: Secondary | ICD-10-CM | POA: Insufficient documentation

## 2018-05-15 DIAGNOSIS — D696 Thrombocytopenia, unspecified: Secondary | ICD-10-CM | POA: Diagnosis not present

## 2018-05-15 DIAGNOSIS — D72822 Plasmacytosis: Secondary | ICD-10-CM | POA: Diagnosis not present

## 2018-05-15 DIAGNOSIS — D649 Anemia, unspecified: Secondary | ICD-10-CM | POA: Diagnosis not present

## 2018-05-15 DIAGNOSIS — R5383 Other fatigue: Secondary | ICD-10-CM | POA: Diagnosis not present

## 2018-05-15 LAB — CBC WITH DIFFERENTIAL/PLATELET
Abs Immature Granulocytes: 0.02 10*3/uL (ref 0.00–0.07)
Basophils Absolute: 0 10*3/uL (ref 0.0–0.1)
Basophils Relative: 0 %
Eosinophils Absolute: 0 10*3/uL (ref 0.0–0.5)
Eosinophils Relative: 0 %
HCT: 35.1 % — ABNORMAL LOW (ref 39.0–52.0)
Hemoglobin: 11.6 g/dL — ABNORMAL LOW (ref 13.0–17.0)
Immature Granulocytes: 1 %
Lymphocytes Relative: 28 %
Lymphs Abs: 0.5 10*3/uL — ABNORMAL LOW (ref 0.7–4.0)
MCH: 29.9 pg (ref 26.0–34.0)
MCHC: 33 g/dL (ref 30.0–36.0)
MCV: 90.5 fL (ref 80.0–100.0)
Monocytes Absolute: 0.1 10*3/uL (ref 0.1–1.0)
Monocytes Relative: 6 %
Neutro Abs: 1.2 10*3/uL — ABNORMAL LOW (ref 1.7–7.7)
Neutrophils Relative %: 65 %
Platelets: 47 10*3/uL — ABNORMAL LOW (ref 150–400)
RBC: 3.88 MIL/uL — ABNORMAL LOW (ref 4.22–5.81)
RDW: 14.3 % (ref 11.5–15.5)
WBC: 1.8 10*3/uL — ABNORMAL LOW (ref 4.0–10.5)
nRBC: 0 % (ref 0.0–0.2)

## 2018-05-15 LAB — HAPTOGLOBIN: Haptoglobin: 182 mg/dL (ref 32–363)

## 2018-05-15 MED ORDER — MIDAZOLAM HCL 2 MG/2ML IJ SOLN
INTRAMUSCULAR | Status: AC | PRN
  Administered 2018-05-15 (×2): 1 mg via INTRAVENOUS

## 2018-05-15 MED ORDER — HYDROCODONE-ACETAMINOPHEN 5-325 MG PO TABS: 1.0000 | ORAL_TABLET | ORAL | Status: DC | PRN

## 2018-05-15 MED ORDER — MIDAZOLAM HCL 2 MG/2ML IJ SOLN
INTRAMUSCULAR | Status: AC
  Filled 2018-05-15: qty 4

## 2018-05-15 MED ORDER — FENTANYL CITRATE (PF) 100 MCG/2ML IJ SOLN
INTRAMUSCULAR | Status: AC
  Filled 2018-05-15: qty 2

## 2018-05-15 MED ORDER — FENTANYL CITRATE (PF) 100 MCG/2ML IJ SOLN
INTRAMUSCULAR | Status: AC | PRN
  Administered 2018-05-15 (×2): 50 ug via INTRAVENOUS

## 2018-05-15 MED ORDER — SODIUM CHLORIDE 0.9 % IV SOLN
INTRAVENOUS | Status: DC
  Administered 2018-05-15: 08:00:00 via INTRAVENOUS

## 2018-05-15 MED ORDER — LIDOCAINE HCL (PF) 1 % IJ SOLN
INTRAMUSCULAR | Status: AC | PRN
  Administered 2018-05-15: 10 mL

## 2018-05-15 NOTE — H&P (Signed)
Chief Complaint: Patient was seen in consultation today for leukopenia  Referring Physician(s): Brunetta Genera  Supervising Physician: Jacqulynn Cadet  Patient Status: [facility] - Out-pt  History of Present Illness: Anthony Stephens is a 68 y.o. male with 3 week history of fevers, headaches, malaise, fatigue, and body aches.  He was seen by Oncology and ID for ongoing work-up and evaluation of his symptoms.  IR has been consulted for bone marrow biopsy and aspiration at the request of Dr. Irene Limbo.   Patient presents today for procedure.  He denies new complaints.  He has been NPO.  He does not take blood thinners.   Past Medical History:  Diagnosis Date   Arthralgia of both knees 05/06/2018   Arthritic-like pain 05/06/2018   FUO (fever of unknown origin) 05/06/2018   Headache 05/06/2018   Leukopenia 05/06/2018   Pharyngitis 05/06/2018   Transaminitis 05/06/2018   Transaminitis 05/06/2018    History reviewed. No pertinent surgical history.  Allergies: Patient has no allergy information on record.  Medications: Prior to Admission medications   Medication Sig Start Date End Date Taking? Authorizing Provider  acetaminophen (TYLENOL) 160 MG/5ML elixir Take 15 mg/kg by mouth every 4 (four) hours as needed for fever.   Yes [provider]  traMADol Veatrice Bourbon) 50 MG tablet  04/24/18  Yes [provider]     History reviewed. No pertinent family history.  Social History   Socioeconomic History   Marital status: Married    Spouse name: Not on file   Number of children: Not on file   Years of education: Not on file   Highest education level: Not on file  Occupational History   Not on file  Social Needs   Financial resource strain: Not on file   Food insecurity:    Worry: Not on file    Inability: Not on file   Transportation needs:    Medical: Not on file    Non-medical: Not on file  Tobacco Use   Smoking status: Never Smoker    Smokeless tobacco: Never Used  Substance and Sexual Activity   Alcohol use: Yes    Comment: occ   Drug use: Never   Sexual activity: Not on file  Lifestyle   Physical activity:    Days per week: Not on file    Minutes per session: Not on file   Stress: Not on file  Relationships   Social connections:    Talks on phone: Not on file    Gets together: Not on file    Attends religious service: Not on file    Active member of club or organization: Not on file    Attends meetings of clubs or organizations: Not on file    Relationship status: Not on file  Other Topics Concern   Not on file  Social History Narrative   Not on file    Review of Systems: A 12 point ROS discussed and pertinent positives are indicated in the HPI above.  All other systems are negative.  Review of Systems  Constitutional: Negative for fatigue and fever.  Respiratory: Negative for cough and shortness of breath.   Cardiovascular: Negative for chest pain.  Gastrointestinal: Negative for abdominal pain.  Psychiatric/Behavioral: Negative for behavioral problems and confusion.    Vital Signs: BP 123/79    Pulse 79    Temp 98.7 F (37.1 C) (Oral)    Resp 18    SpO2 94%   Physical Exam Vitals signs and nursing note  reviewed.  Constitutional:      Appearance: Normal appearance.  HENT:     Mouth/Throat:     Mouth: Mucous membranes are moist.     Pharynx: Oropharynx is clear.  Cardiovascular:     Rate and Rhythm: Normal rate and regular rhythm.     Pulses: Normal pulses.  Pulmonary:     Effort: Pulmonary effort is normal.     Breath sounds: Normal breath sounds.  Skin:    General: Skin is warm and dry.  Neurological:     General: No focal deficit present.     Mental Status: He is alert and oriented to person, place, and time. Mental status is at baseline.  Psychiatric:        Mood and Affect: Mood normal.        Behavior: Behavior normal.        Thought Content: Thought content normal.          Judgment: Judgment normal.      MD Evaluation Airway: WNL Heart: WNL Abdomen: WNL Chest/ Lungs: WNL ASA  Classification: 3 Mallampati/Airway Score: One   Imaging: Dg Chest 2 View  Result Date: 04/30/2018 CLINICAL DATA:  68 year old male with a history of no chest complaints. History of leukopenia EXAM: CHEST - 2 VIEW COMPARISON:  None. FINDINGS: Cardiomediastinal silhouette within normal limits. Partially calcified lymph nodes of the right hilum. No pneumothorax or pleural effusion. No confluent airspace disease. No displaced fracture. IMPRESSION: Negative for acute cardiopulmonary disease. Calcified lymph nodes of the right hilum, potentially representing prior granulomatous disease Electronically Signed   By: Corrie Mckusick D.O.   On: 04/30/2018 14:57   Ct Chest W Contrast  Result Date: 05/07/2018 CLINICAL DATA:  Fever of unknown origin, neutropenia EXAM: CT CHEST, ABDOMEN, AND PELVIS WITH CONTRAST TECHNIQUE: Multidetector CT imaging of the chest, abdomen and pelvis was performed following the standard protocol during bolus administration of intravenous contrast. CONTRAST:  133m ISOVUE-300 IOPAMIDOL (ISOVUE-300) INJECTION 61% COMPARISON:  None. FINDINGS: CT CHEST FINDINGS Cardiovascular: Left coronary artery calcifications. Normal heart size. No pericardial effusion. Mediastinum/Nodes: Enlarged mediastinal lymph nodes, largest pretracheal nodes measuring 1.4 cm in short axis. Densely calcified right hilar lymph nodes, in keeping with prior granulomatous infection. Thyroid gland, trachea, and esophagus demonstrate no significant findings. Lungs/Pleura: There is a 5 mm nodule of the left upper lobe (series 6, image 94). Additional benign calcified nodules of the right upper lobe. No pleural effusion or pneumothorax. Musculoskeletal: No chest wall mass or suspicious bone lesions identified. CT ABDOMEN PELVIS FINDINGS Hepatobiliary: No focal liver abnormality is seen. No gallstones,  gallbladder wall thickening, or biliary dilatation. Pancreas: Unremarkable. No pancreatic ductal dilatation or surrounding inflammatory changes. Spleen: Normal in size without focal abnormality. Adrenals/Urinary Tract: Adrenal glands are unremarkable. Kidneys are normal, without renal calculi, focal lesion, or hydronephrosis. Bladder is unremarkable. Stomach/Bowel: Stomach is within normal limits. Appendix appears normal. No evidence of bowel wall thickening, distention, or inflammatory changes. Vascular/Lymphatic: Calcific atherosclerosis. Enlarged retroperitoneal and portacaval lymph nodes measuring up to 1.4 cm in short axis (series 2, image 77). Reproductive: No mass or other abnormality. Other: No abdominal wall hernia or abnormality. No abdominopelvic ascites. Musculoskeletal: No acute or significant osseous findings. IMPRESSION: 1. No definite CT findings of the chest, abdomen, or pelvis to explain fever of unknown origin. 2. There are enlarged mediastinal and retroperitoneal lymph nodes, measuring up to 1.4 cm in short axis. These lymph nodes are nonspecific and of uncertain etiology or significance. 3. There is an  incidental 5 mm nodule of the left upper lobe (series 6, image 94). CT follow-up of this nodule is optional at 12 months if high risk factors for lung cancer are present. 4.  Other chronic and incidental findings as detailed above. Electronically Signed   By: Eddie Candle M.D.   On: 05/07/2018 15:38   Ct Abdomen Pelvis W Contrast  Result Date: 05/07/2018 CLINICAL DATA:  Fever of unknown origin, neutropenia EXAM: CT CHEST, ABDOMEN, AND PELVIS WITH CONTRAST TECHNIQUE: Multidetector CT imaging of the chest, abdomen and pelvis was performed following the standard protocol during bolus administration of intravenous contrast. CONTRAST:  183m ISOVUE-300 IOPAMIDOL (ISOVUE-300) INJECTION 61% COMPARISON:  None. FINDINGS: CT CHEST FINDINGS Cardiovascular: Left coronary artery calcifications. Normal  heart size. No pericardial effusion. Mediastinum/Nodes: Enlarged mediastinal lymph nodes, largest pretracheal nodes measuring 1.4 cm in short axis. Densely calcified right hilar lymph nodes, in keeping with prior granulomatous infection. Thyroid gland, trachea, and esophagus demonstrate no significant findings. Lungs/Pleura: There is a 5 mm nodule of the left upper lobe (series 6, image 94). Additional benign calcified nodules of the right upper lobe. No pleural effusion or pneumothorax. Musculoskeletal: No chest wall mass or suspicious bone lesions identified. CT ABDOMEN PELVIS FINDINGS Hepatobiliary: No focal liver abnormality is seen. No gallstones, gallbladder wall thickening, or biliary dilatation. Pancreas: Unremarkable. No pancreatic ductal dilatation or surrounding inflammatory changes. Spleen: Normal in size without focal abnormality. Adrenals/Urinary Tract: Adrenal glands are unremarkable. Kidneys are normal, without renal calculi, focal lesion, or hydronephrosis. Bladder is unremarkable. Stomach/Bowel: Stomach is within normal limits. Appendix appears normal. No evidence of bowel wall thickening, distention, or inflammatory changes. Vascular/Lymphatic: Calcific atherosclerosis. Enlarged retroperitoneal and portacaval lymph nodes measuring up to 1.4 cm in short axis (series 2, image 77). Reproductive: No mass or other abnormality. Other: No abdominal wall hernia or abnormality. No abdominopelvic ascites. Musculoskeletal: No acute or significant osseous findings. IMPRESSION: 1. No definite CT findings of the chest, abdomen, or pelvis to explain fever of unknown origin. 2. There are enlarged mediastinal and retroperitoneal lymph nodes, measuring up to 1.4 cm in short axis. These lymph nodes are nonspecific and of uncertain etiology or significance. 3. There is an incidental 5 mm nodule of the left upper lobe (series 6, image 94). CT follow-up of this nodule is optional at 12 months if high risk factors for  lung cancer are present. 4.  Other chronic and incidental findings as detailed above. Electronically Signed   By: AEddie CandleM.D.   On: 05/07/2018 15:38    Labs:  CBC: Recent Labs    05/02/18 1329 05/06/18 1542 05/14/18 1400 05/15/18 0746  WBC 1.3* CANCELED 2.0* 1.8*  HGB 12.4*  --  10.7* 11.6*  HCT 37.3*  --  31.6* 35.1*  PLT 89*  --  44* 47*    COAGS: Recent Labs    05/02/18 1326  INR 1.0  APTT 29    BMP: Recent Labs    05/02/18 1329 05/06/18 1557  NA 138 134*  K 4.1 4.7  CL 102 100  CO2 27 26  GLUCOSE 96 99  BUN 16 17  CALCIUM 8.6* 8.6  CREATININE 0.95 1.00  GFRNONAA >60 79  GFRAA >60 91    LIVER FUNCTION TESTS: Recent Labs    05/02/18 1329 05/06/18 1557  BILITOT 0.4 0.4  AST 45* 43*  ALT 48* 28  ALKPHOS 70  --   PROT 6.8 6.4  ALBUMIN 3.3*  --     TUMOR MARKERS: No  results for input(s): AFPTM, CEA, CA199, CHROMGRNA in the last 8760 hours.  Assessment and Plan: Patient with past medical history of fever of unknown origin, thrombocytopenia, leukopenia, and malaise presents for bone marrow biopsy at the request of Dr. Irene Limbo. Case reviewed by Dr. Laurence Ferrari who approves patient for procedure.  Patient presents today in their usual state of health.  He has been NPO and is not currently on blood thinners.   Risks and benefits of biopsy was discussed with the patient and/or patient's family including, but not limited to bleeding, infection, damage to adjacent structures or low yield requiring additional tests.  All of the questions were answered and there is agreement to proceed.  Consent signed and in chart.  Thank you for this interesting consult.  I greatly enjoyed meeting Yosgar Demirjian and look forward to participating in their care.  A copy of this report was sent to the requesting provider on this date.  Electronically Signed: Docia Barrier, PA 05/15/2018, 8:40 AM   I spent a total of  30 Minutes   in face to face in  clinical consultation, greater than 50% of which was counseling/coordinating care for leukopenia, thrombocytopenia.

## 2018-05-15 NOTE — Procedures (Signed)
Interventional Radiology Procedure Note  Procedure: CT guided aspirate and core biopsy of right iliac bone Complications: None Recommendations: - Bedrest supine x 1 hrs - Hydrocodone PRN  Pain - Follow biopsy results  Signed,  Heath K. McCullough, MD   

## 2018-05-15 NOTE — Discharge Instructions (Signed)
You may shower and remove your dressing tomorrow.  Moderate Conscious Sedation, Adult, Care After These instructions provide you with information about caring for yourself after your procedure. Your health care provider may also give you more specific instructions. Your treatment has been planned according to current medical practices, but problems sometimes occur. Call your health care provider if you have any problems or questions after your procedure. What can I expect after the procedure? After your procedure, it is common:  To feel sleepy for several hours.  To feel clumsy and have poor balance for several hours.  To have poor judgment for several hours.  To vomit if you eat too soon. Follow these instructions at home: For at least 24 hours after the procedure:   Do not: ? Participate in activities where you could fall or become injured. ? Drive. ? Use heavy machinery. ? Drink alcohol. ? Take sleeping pills or medicines that cause drowsiness. ? Make important decisions or sign legal documents. ? Take care of children on your own.  Rest. Eating and drinking  Follow the diet recommended by your health care provider.  If you vomit: ? Drink water, juice, or soup when you can drink without vomiting. ? Make sure you have little or no nausea before eating solid foods. General instructions  Have a responsible adult stay with you until you are awake and alert.  Take over-the-counter and prescription medicines only as told by your health care provider.  If you smoke, do not smoke without supervision.  Keep all follow-up visits as told by your health care provider. This is important. Contact a health care provider if:  You keep feeling nauseous or you keep vomiting.  You feel light-headed.  You develop a rash.  You have a fever. Get help right away if:  You have trouble breathing. This information is not intended to replace advice given to you by your health care  provider. Make sure you discuss any questions you have with your health care provider. Document Released: 10/15/2012 Document Revised: 05/30/2015 Document Reviewed: 04/16/2015 Elsevier Interactive Patient Education  2019 [place].   Bone Marrow Aspiration and Bone Marrow Biopsy, Adult, Care After This sheet gives you information about how to care for yourself after your procedure. Your health care provider may also give you more specific instructions. If you have problems or questions, contact your health care provider. What can I expect after the procedure? After the procedure, it is common to have:  Mild pain and tenderness.  Swelling.  Bruising. Follow these instructions at home: Puncture site care      Follow instructions from your health care provider about how to take care of the puncture site. Make sure you: ? Wash your hands with soap and water before you change your bandage (dressing). If soap and water are not available, use hand sanitizer. ? Change your dressing as told by your health care provider.  Check your puncture siteevery day for signs of infection. Check for: ? More redness, swelling, or pain. ? More fluid or blood. ? Warmth. ? Pus or a bad smell. General instructions  Take over-the-counter and prescription medicines only as told by your health care provider.  Do not take baths, swim, or use a hot tub until your health care provider approves. Ask if you can take a shower or have a sponge bath.  Return to your normal activities as told by your health care provider. Ask your health care provider what activities are safe for you.  Do not drive for 24 hours if you were given a medicine to help you relax (sedative) during your procedure.  Keep all follow-up visits as told by your health care provider. This is important. Contact a health care provider if:  Your pain is not controlled with medicine. Get help right away if:  You have a fever.  You  have more redness, swelling, or pain around the puncture site.  You have more fluid or blood coming from the puncture site.  Your puncture site feels warm to the touch.  You have pus or a bad smell coming from the puncture site. These symptoms may represent a serious problem that is an emergency. Do not wait to see if the symptoms will go away. Get medical help right away. Call your local emergency services (911 in the [place] Do not drive yourself to the hospital. Summary  After the procedure, it is common to have mild pain, tenderness, swelling, and bruising.  Follow instructions from your health care provider about how to take care of the puncture site.  Get help right away if you have any symptoms of infection or if you have more blood or fluid coming from the puncture site. This information is not intended to replace advice given to you by your health care provider. Make sure you discuss any questions you have with your health care provider. Document Released: 07/14/2004 Document Revised: 04/09/2017 Document Reviewed: 06/08/2015 Elsevier Interactive Patient Education  2019 [employer].

## 2018-05-16 ENCOUNTER — Ambulatory Visit: Payer: Medicare Other | Admitting: Hematology

## 2018-05-16 ENCOUNTER — Other Ambulatory Visit: Payer: Medicare Other

## 2018-05-16 LAB — ACID FAST SMEAR (AFB, MYCOBACTERIA): Acid Fast Smear: NEGATIVE

## 2018-05-19 DIAGNOSIS — R509 Fever, unspecified: Secondary | ICD-10-CM | POA: Diagnosis not present

## 2018-05-20 ENCOUNTER — Other Ambulatory Visit: Payer: Self-pay

## 2018-05-20 LAB — AEROBIC/ANAEROBIC CULTURE (SURGICAL/DEEP WOUND)

## 2018-05-20 LAB — AEROBIC/ANAEROBIC CULTURE W GRAM STAIN (SURGICAL/DEEP WOUND)
Culture: NO GROWTH
Special Requests: NORMAL

## 2018-05-21 ENCOUNTER — Encounter: Payer: Self-pay | Admitting: Infectious Disease

## 2018-05-21 ENCOUNTER — Ambulatory Visit (INDEPENDENT_AMBULATORY_CARE_PROVIDER_SITE_OTHER): Payer: Medicare Other | Admitting: Infectious Disease

## 2018-05-21 ENCOUNTER — Telehealth: Payer: Self-pay

## 2018-05-21 ENCOUNTER — Other Ambulatory Visit: Payer: Self-pay

## 2018-05-21 VITALS — BP 99/64 | HR 91 | Temp 98.7°F | Wt 165.0 lb

## 2018-05-21 DIAGNOSIS — M25562 Pain in left knee: Secondary | ICD-10-CM | POA: Diagnosis not present

## 2018-05-21 DIAGNOSIS — M2550 Pain in unspecified joint: Secondary | ICD-10-CM | POA: Diagnosis not present

## 2018-05-21 DIAGNOSIS — D708 Other neutropenia: Secondary | ICD-10-CM | POA: Diagnosis not present

## 2018-05-21 DIAGNOSIS — M25561 Pain in right knee: Secondary | ICD-10-CM

## 2018-05-21 DIAGNOSIS — R740 Nonspecific elevation of levels of transaminase and lactic acid dehydrogenase [LDH]: Secondary | ICD-10-CM | POA: Diagnosis not present

## 2018-05-21 DIAGNOSIS — R7402 Elevation of levels of lactic acid dehydrogenase (LDH): Secondary | ICD-10-CM

## 2018-05-21 DIAGNOSIS — R509 Fever, unspecified: Secondary | ICD-10-CM | POA: Diagnosis not present

## 2018-05-21 DIAGNOSIS — J029 Acute pharyngitis, unspecified: Secondary | ICD-10-CM

## 2018-05-21 NOTE — Telephone Encounter (Signed)
[facility] to schedule a Echocardiogram for [facility].  Unfortunately, unable to schedule an appointment at this moment.  Heart center is  currently working on Tier 1 cases top priority study cases.    Lenore Cordia, [place]

## 2018-05-21 NOTE — Progress Notes (Signed)
Subjective:    Patient ID: Anthony Stephens, male    DOB: 02/15/1950, 68 y.o.   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  He had been in his usual state of health until the weekend of April 4 and.  Had begun developing low-grade fevers with malaise and intense headaches which worsened and were subtle and temporal areas with throbbing and associated with onset of fevers.  This very same time he developed sinus congestion and had bloody discharge.  He also had developed intense pains in his wrists bilaterally with reduced range of motion, and also intense pains in his knees bilaterally which made it difficult for him to climb stairs without supporting himself.  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  He continued to feel poorly however and was then retested later that week after having worsening muscle aches and swelling in his joints and hands and knees.  Repeat COVID test was again negative.  He had laboratory work done at his primary care physician's office which was pertinent for mild elevation of his transaminases along with an elevated LDH and a markedly suppressed white blood cell count which was at 1.6 thousand and reduced platelets to 50,000 below his normal range for him which is 90 200,000.  Legitimate concern for tickborne infection such as [facility] spotted fever he was initiated on doxycycline 100 mg twice daily for 10 days and states that he felt slightly better while on the doxycycline.  Further laboratory data obtained at  Dr. Inda Merlin office had shown him to have a negative RMSF IgM, negative Shelda Pal for Lyme, evidence of past infection with Epstein-Barr virus but not current infection with this virus.  CMV titers were negative.  It was normal.  Hepatitis C antibody was negative   He was then seen by Dr. Irene Limbo at the Cancer center.  His primary concern remained for a viral syndrome that was causing his constellation of symptoms.  Repeated labs which here showed normal chemistry with AST and ALT of 45 and 48 LDH up to 408, CBC showed count of 1300 with 800 neutrophils 40 lymphocytes and 100 monocytes had slight anemia with hemoglobin 12.4 that was normocytic and normochromic and platelets that are risen to 8 8 9000.  He had a chest x-ray performed which showed evidence of divide granulomas.  Over the weekend he had began to feel better and his headaches had improved dramatically.  His knee pain is still there when he climbs stairs but not nearly as debilitating as it had been.  His wrist continue to be tender and he feels warm to touch and has reduced dorsiflexion.  After the weekend  Prior to seeing me on April 28th  and early in that week he again started feeling poorly with current fevers up to 101.6 and mentioned again joint pain in his wrist in particular and profound fatigue.  Also developed a dry cough and a bit of a sore throat which is at times had a slight exudate accompanying it.  He has not had any rashes  he has had some mild nausea when his fevers were bad.  His travel history he grew up in [place].  He has traveled to the [place] including [place] and [place].  He does not recall having any type of respiratory illness while in those areas.  He has no known exposure to tuberculosis.  He has not traveled to the [place] or to [place] or [place].  He is only travel to [place] and typically to [place].  He had no recent travel at all and no sick contacts in his  household.  He does have a sister who has CML that is not on treatment at this point time his mother had lung cancer but she was a known smoker.  Partner Dr. Baxter Flattery I reviewed his case and felt that he seemed an unlikely patient to have a coronavirus 2019 given that he had 2- PCR's when he had become acutely ill and did not have lower respiratory symptoms.  While I suppose is possible that the blood from his nasal discharge could have potentially compromise the PCR reaction I would have confidence in it.  We felt comfortable that he did not have this as is diagnosis explaining his symptoms and I brought him into clinic to work him up for fever of unknown origin.  Reviewed his radiographs personally which show no active pulmonary disease also reviewed his laboratory data both done here at the cancer center and at his primary care physician's office.  Labs and workup done since I saw him have included a negative HIV antibody, negative HCV, negative histoplasma ag, negative blood cultures. He did  Have an elevated ferritin and continued to have an elevated LDH. Transaminases were mildly elevated. 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 He underwent bone marrow biopsy which showed no evidence of malignancy. Labs done at [facility] continued to show leukopenia, TTPenia, slightly elevated AST, LDH climbing but no evidence of hemolysis.   CK had been normal  He continues to feel poorly. He has patter of having profound weakness and fatigue every morning upon awakening even after 8 hours of sleep. He has feared he might fall at times. He no longer has specific weakness in legs with arthralgias but continues to have wrist pain and reduced ROM  Towards night time he develops fevers at times to 101, 100 last few nights. He has woken up with  drenching sweats at times as well.  He does have a dry cough and still somewhat of a sore throat.  He had repeat COVID 19 testing t Dr. Willy Eddy COVID testing center on Monday and back negative today.  I brought him back in to do some additional workup for Infectious Disease causes and recheck labs, also considerirng echocardiogram to exclue endocarditis before giving empiric steroids for possible Still's disease.    Past Medical History:  Diagnosis Date  . Arthralgia of both knees 05/06/2018  . Arthritic-like pain 05/06/2018  . FUO (fever of unknown origin) 05/06/2018  . Headache 05/06/2018  . Leukopenia 05/06/2018  . Pharyngitis 05/06/2018  . Transaminitis 05/06/2018  . Transaminitis 05/06/2018    No past surgical history on file.  No family history on file.    Social History   Socioeconomic History  . Marital status: Married    Spouse name: Not  on file  . Number of children: Not on file  . Years of education: Not on file  . Highest education level: Not on file  Occupational History  . Not on file  Social Needs  . Financial resource strain: Not on file  . Food insecurity:    Worry: Not on file    Inability: Not on file  . Transportation needs:    Medical: Not on file    Non-medical: Not on file  Tobacco Use  . Smoking status: Never Smoker  . Smokeless tobacco: Never Used  Substance and Sexual Activity  . Alcohol use: Yes    Comment: occ  . Drug use: Never  . Sexual activity: Not on file  Lifestyle  . Physical activity:    Days per week: Not on file    Minutes per session: Not on file  . Stress: Not on file  Relationships  . Social connections:    Talks on phone: Not on file    Gets together: Not on file    Attends religious service: Not on file    Active member of club or organization: Not on file    Attends meetings of clubs or organizations: Not on file    Relationship status: Not on file  Other Topics Concern  . Not on file  Social History Narrative   . Not on file    Not on File   Current Outpatient Medications:  .  acetaminophen (TYLENOL) 160 MG/5ML elixir, Take 15 mg/kg by mouth every 4 (four) hours as needed for fever., Disp: , Rfl:  .  doxycycline (VIBRAMYCIN) 100 MG capsule, , Disp: , Rfl:  .  traMADol (ULTRAM) 50 MG tablet, , Disp: , Rfl:        Review of Systems  Constitutional: Positive for activity change, appetite change, chills, diaphoresis, fatigue and fever.  HENT: Positive for congestion, rhinorrhea and sore throat. Negative for nosebleeds.   Eyes: Negative for photophobia, pain, discharge, itching and visual disturbance.  Respiratory: Positive for cough. Negative for apnea, choking, chest tightness, shortness of breath and wheezing.   Cardiovascular: Negative for chest pain, palpitations and leg swelling.  Gastrointestinal: Negative for abdominal distention, abdominal pain, blood in stool, constipation, diarrhea and vomiting.  Genitourinary: Negative for dysuria, flank pain and hematuria.  Musculoskeletal: Positive for arthralgias, joint swelling and myalgias. Negative for back pain, neck pain and neck stiffness.  Skin: Negative for color change, pallor, rash and wound.  Neurological: Positive for dizziness, weakness and light-headedness. Negative for tremors, seizures, facial asymmetry, speech difficulty, numbness and headaches.  Hematological: Negative for adenopathy. Does not bruise/bleed easily.  Psychiatric/Behavioral: Negative for agitation, behavioral problems, confusion, dysphoric mood, self-injury and suicidal ideas. The patient is not nervous/anxious.        Objective:   Physical Exam Vitals signs and nursing note reviewed.  Constitutional:      General: He is not in acute distress.    Appearance: Normal appearance. He is well-developed. He is not ill-appearing or diaphoretic.  HENT:     Head: Normocephalic and atraumatic.     Right Ear: Hearing and external ear normal.     Left Ear: Hearing and  external ear normal.     Nose: No nasal deformity, congestion or rhinorrhea.     Mouth/Throat:     Mouth: Mucous membranes are moist.     Pharynx: Oropharyngeal exudate and posterior oropharyngeal erythema present.  Eyes:     General: No scleral icterus.  Left eye: No discharge.     Extraocular Movements: Extraocular movements intact.     Conjunctiva/sclera: Conjunctivae normal.     Right eye: Right conjunctiva is not injected.     Left eye: Left conjunctiva is not injected.     Pupils: Pupils are equal, round, and reactive to light.  Neck:     Musculoskeletal: Normal range of motion and neck supple. No neck rigidity or muscular tenderness.     Vascular: No JVD.  Cardiovascular:     Rate and Rhythm: Normal rate and regular rhythm.     Heart sounds: Normal heart sounds, S1 normal and S2 normal. No murmur. No friction rub. No gallop.   Pulmonary:     Effort: Pulmonary effort is normal. No respiratory distress.     Breath sounds: Normal breath sounds. No stridor. No wheezing, rhonchi or rales.  Abdominal:     General: Abdomen is flat. Bowel sounds are normal. There is no distension.     Palpations: Abdomen is soft. There is no hepatomegaly, splenomegaly or mass.     Tenderness: There is no abdominal tenderness. There is no guarding.     Hernia: No hernia is present.  Musculoskeletal: Normal range of motion.        General: No deformity or signs of injury.     Right shoulder: Normal.     Left shoulder: Normal.     Right hip: Normal.     Left hip: Normal.     Right knee: Normal.     Left knee: Normal.     Right lower leg: No edema.     Left lower leg: No edema.  Lymphadenopathy:     Head:     Right side of head: No submental, submandibular, tonsillar, preauricular or posterior auricular adenopathy.     Left side of head: No submental, submandibular, tonsillar, preauricular or posterior auricular adenopathy.     Cervical: No cervical adenopathy.     Right cervical: No  superficial or deep cervical adenopathy.    Left cervical: No superficial or deep cervical adenopathy.     Upper Body:     Right upper body: No supraclavicular adenopathy.     Left upper body: No supraclavicular adenopathy.  Skin:    General: Skin is warm and dry.     Coloration: Skin is not jaundiced or pale.     Findings: No abrasion, bruising, ecchymosis, erythema, lesion or rash.     Nails: There is no clubbing.   Neurological:     General: No focal deficit present.     Mental Status: He is alert and oriented to person, place, and time.     Sensory: No sensory deficit.     Coordination: Coordination normal.     Gait: Gait normal.  Psychiatric:        Attention and Perception: He is attentive.        Mood and Affect: Mood normal.        Speech: Speech normal.        Behavior: Behavior normal. Behavior is cooperative.        Thought Content: Thought content normal.        Judgment: Judgment normal.    Wrists : He does have pain in his wrists and some slight tenderness to palpation and reduced dorsiflexion due to the pain.  There is not an obvious effusion in either wrist.  Knees are without effusions and are nontender.      Assessment & Plan:  FUO:   I think that Stills or other autoimmune process seems most likely. Ferritin not as high as I have typically seen but I am not a Rheumatologist.  We had wished to exclude this as some strange post-COVID phenomenon but to have never even had an abnormal CT chest would seem unreasonable for this diagnosis  I am sending urine blastomyces antigen and coccidiodies antibodies (could have sent also crypto ag in serum)  I am checking CMV PCR, EBV PCR  Repeat CMP, CBC w Diff, ESR, CRP. LDH and ferritin  I am checking blood cultures again since now removed from antibacterials by weeks  I had ordered TTE but these are not being done per office  I discussed with Beverley Fiedler who will discuss option of doing TEE on Friday  I  also reviewed case with Dr. Leigh Aurora who is happy to see Dr Excell Seltzer  HE thought the case with pharyngitis, arthralgias, athritis, hematologic abnormalities, transaminiits might fit very well with Stills and he supported trial of steroids 44m x week then 495mx week taper etc when I felt comfortable initiating this  He would also be happy to see Dr. HoExcell Seltzernd I will put them in touch with one another as well

## 2018-05-21 NOTE — Telephone Encounter (Signed)
I will give Anthony Stephens a call then. I dont think this is acceptable

## 2018-05-22 ENCOUNTER — Telehealth: Payer: Self-pay | Admitting: Infectious Disease

## 2018-05-22 DIAGNOSIS — M0820 Juvenile rheumatoid arthritis with systemic onset, unspecified site: Secondary | ICD-10-CM

## 2018-05-22 MED ORDER — PREDNISONE 20 MG PO TABS: ORAL_TABLET | ORAL | 2 refills | Status: AC

## 2018-05-22 NOTE — Telephone Encounter (Signed)
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  His echocardiogram will not likely happen till next week.  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

## 2018-05-26 LAB — MVISTA BLASTOMYCES QNT AG, URINE
Interpretation: NEGATIVE
Result (ng/ml): NOT DETECTED ng/mL

## 2018-05-28 ENCOUNTER — Telehealth: Payer: Self-pay | Admitting: *Deleted

## 2018-05-28 NOTE — Telephone Encounter (Signed)
Contacted by [employer] with [employer] disability. She asked to verify patient appt dates w/Dr. Irene Limbo (4/24) and asked if f/u appt scheduled (none at this time). Informed her Dr. Irene Limbo not primary MD-- had seen patient as a consulting MD.  Provided fax number. Received faxed forms from [facility], sending forms to [facility] disability specialist.

## 2018-05-29 LAB — CBC WITH DIFFERENTIAL/PLATELET
Absolute Monocytes: 51 cells/uL — ABNORMAL LOW (ref 200–950)
Basophils Absolute: 11 cells/uL (ref 0–200)
Basophils Relative: 0.7 %
Eosinophils Absolute: 0 cells/uL — ABNORMAL LOW (ref 15–500)
Eosinophils Relative: 0 %
HCT: 30.5 % — ABNORMAL LOW (ref 38.5–50.0)
Hemoglobin: 10.2 g/dL — ABNORMAL LOW (ref 13.2–17.1)
Lymphs Abs: 467 cells/uL — ABNORMAL LOW (ref 850–3900)
MCH: 29.7 pg (ref 27.0–33.0)
MCHC: 33.4 g/dL (ref 32.0–36.0)
MCV: 88.7 fL (ref 80.0–100.0)
MPV: 12 fL (ref 7.5–12.5)
Monocytes Relative: 3.4 %
Neutro Abs: 972 cells/uL — ABNORMAL LOW (ref 1500–7800)
Neutrophils Relative %: 64.8 %
Platelets: 65 10*3/uL — ABNORMAL LOW (ref 140–400)
RBC: 3.44 10*6/uL — ABNORMAL LOW (ref 4.20–5.80)
RDW: 13.9 % (ref 11.0–15.0)
Total Lymphocyte: 31.1 %
WBC: 1.5 10*3/uL — ABNORMAL LOW (ref 3.8–10.8)

## 2018-05-29 LAB — CK: Total CK: 126 U/L (ref 44–196)

## 2018-05-29 LAB — SAR COV2 SEROLOGY (COVID19)AB(IGG),IA: SARS CoV2 AB IGG: NEGATIVE

## 2018-05-29 LAB — COMPLETE METABOLIC PANEL WITH GFR
AG Ratio: 0.7 (calc) — ABNORMAL LOW (ref 1.0–2.5)
ALT: 118 U/L — ABNORMAL HIGH (ref 9–46)
AST: 108 U/L — ABNORMAL HIGH (ref 10–35)
Albumin: 2.7 g/dL — ABNORMAL LOW (ref 3.6–5.1)
Alkaline phosphatase (APISO): 125 U/L (ref 35–144)
BUN: 13 mg/dL (ref 7–25)
CO2: 28 mmol/L (ref 20–32)
Calcium: 8.4 mg/dL — ABNORMAL LOW (ref 8.6–10.3)
Chloride: 101 mmol/L (ref 98–110)
Creat: 0.91 mg/dL (ref 0.70–1.25)
GFR, Est African American: 102 mL/min/{1.73_m2} (ref >=60)
GFR, Est Non African American: 88 mL/min/{1.73_m2} (ref >=60)
Globulin: 4.1 g/dL (calc) — ABNORMAL HIGH (ref 1.9–3.7)
Glucose, Bld: 93 mg/dL (ref 65–99)
Potassium: 4.4 mmol/L (ref 3.5–5.3)
Sodium: 134 mmol/L — ABNORMAL LOW (ref 135–146)
Total Bilirubin: 0.5 mg/dL (ref 0.2–1.2)
Total Protein: 6.8 g/dL (ref 6.1–8.1)

## 2018-05-29 LAB — CULTURE BLOOD MANUAL
Micro Number: 474460
Micro Number: 474466
Result: NO GROWTH
Result: NO GROWTH

## 2018-05-29 LAB — COCCIDIOIDES ANTIBODIES: COCCIDIOIDES AB, CF, SERUM: <1:2 {titer}

## 2018-05-29 LAB — LACTATE DEHYDROGENASE: LDH: 854 U/L — ABNORMAL HIGH (ref 120–250)

## 2018-05-29 LAB — CMV (CYTOMEGALOVIRUS) DNA ULTRAQUANT, PCR
CMV DNA, QN PCR: <2.3 log IU/mL (ref <2.30)
CMV DNA, QN Real Time PCR: <200 IU/mL (ref <200)

## 2018-05-29 LAB — FERRITIN: Ferritin: 2261 ng/mL — ABNORMAL HIGH (ref 24–380)

## 2018-05-29 LAB — SEDIMENTATION RATE: Sed Rate: 77 mm/h — ABNORMAL HIGH (ref 0–20)

## 2018-05-29 LAB — C-REACTIVE PROTEIN: CRP: 18.6 mg/L — ABNORMAL HIGH (ref <8.0)

## 2018-06-04 ENCOUNTER — Telehealth: Payer: Self-pay | Admitting: Infectious Disease

## 2018-06-04 ENCOUNTER — Encounter: Payer: Self-pay | Admitting: Infectious Disease

## 2018-06-04 DIAGNOSIS — M0820 Juvenile rheumatoid arthritis with systemic onset, unspecified site: Secondary | ICD-10-CM

## 2018-06-04 HISTORY — DX: Juvenile rheumatoid arthritis with systemic onset, unspecified site: M08.20

## 2018-06-04 NOTE — Telephone Encounter (Signed)
Filled out disability forms for short term

## 2018-06-06 ENCOUNTER — Telehealth: Payer: Self-pay | Admitting: *Deleted

## 2018-06-06 NOTE — Telephone Encounter (Signed)
Received signed record request from [employer] for patient's short term disability claim. Asking for Dr Lucianne Lei Dam's office notes only to be faxed to [phone].  DOne. Landis Gandy, RN

## 2018-06-12 DIAGNOSIS — Z6821 Body mass index (BMI) 21.0-21.9, adult: Secondary | ICD-10-CM | POA: Diagnosis not present

## 2018-06-12 DIAGNOSIS — M061 Adult-onset Still's disease: Secondary | ICD-10-CM | POA: Diagnosis not present

## 2018-06-12 DIAGNOSIS — M2550 Pain in unspecified joint: Secondary | ICD-10-CM | POA: Diagnosis not present

## 2018-06-14 LAB — FUNGAL ORGANISM REFLEX

## 2018-06-14 LAB — FUNGUS CULTURE WITH STAIN

## 2018-06-14 LAB — FUNGUS CULTURE RESULT

## 2018-06-27 LAB — ACID FAST CULTURE WITH REFLEXED SENSITIVITIES (MYCOBACTERIA): Acid Fast Culture: NEGATIVE

## 2018-07-03 DIAGNOSIS — R7989 Other specified abnormal findings of blood chemistry: Secondary | ICD-10-CM | POA: Diagnosis not present

## 2018-07-03 DIAGNOSIS — Z6822 Body mass index (BMI) 22.0-22.9, adult: Secondary | ICD-10-CM | POA: Diagnosis not present

## 2018-07-03 DIAGNOSIS — M061 Adult-onset Still's disease: Secondary | ICD-10-CM | POA: Diagnosis not present

## 2018-07-14 ENCOUNTER — Ambulatory Visit: Payer: Self-pay

## 2018-07-14 DIAGNOSIS — Z20822 Contact with and (suspected) exposure to covid-19: Secondary | ICD-10-CM

## 2018-07-14 NOTE — Telephone Encounter (Signed)
Sunday with fever 102.8. Headache (mild), mild nasal congestion Pt is a Garment/textile technologist at [employer]. Pt has talked to PCP and PCP wnts pt tested and with being a [employer] employee- order is not needed. 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 Pt has been previously tested x 3 before. Pt denies SOB or difficulty breathing.Pt stated having mild nasal congestion and mild headache. Care advice given and pt verbalized understanding. Pt scheduled for Covid 07/15/18 at .10:00 at [facility].  Reason for Disposition . HIGH RISK patient (e.g., age > 76 years, diabetes, heart or lung disease, weak immune system)  Answer Assessment - Initial Assessment Questions 1. COVID-19 DIAGNOSIS: "Who made your Coronavirus (COVID-19) diagnosis?" "Was it confirmed by a positive lab test?" If not diagnosed by a HCP, ask "Are there lots of cases (community spread) where you live?" (See public health department website, if unsure)     *No Answer* 2. ONSET: "When did the COVID-19 symptoms start?"      Sunday 3. WORST SYMPTOM: "What is your worst symptom?" (e.g., cough, fever, shortness of breath, muscle aches)     fever 4. COUGH: "Do you have a cough?" If so, ask: "How bad is the cough?"       no 5. FEVER: "Do you have a fever?" If so, ask: "What is your temperature, how was it measured, and when did it start?"     Yes- this am 101.4 6. RESPIRATORY STATUS: "Describe your breathing?" (e.g., shortness of breath, wheezing, unable to speak)      normal 7. BETTER-SAME-WORSE: "Are you getting better, staying the same or getting worse compared to yesterday?"  If getting worse, ask, "In what way?"     better 8. HIGH RISK DISEASE: "Do you have any chronic medical problems?" (e.g., asthma, heart or lung disease, weak immune system, etc.)     Stills dx, 9. PREGNANCY: "Is there any chance you are pregnant?" "When was your last menstrual period?"     *No Answer* 10. OTHER SYMPTOMS: "Do you have any other symptoms?"  (e.g., chills,  fatigue, headache, loss of smell or taste, muscle pain, sore throat)       Mild nasal congestion, mild headache  Protocols used: CORONAVIRUS (COVID-19) DIAGNOSED OR SUSPECTED-A-AH

## 2018-07-15 ENCOUNTER — Other Ambulatory Visit: Payer: Medicare Other

## 2018-07-15 DIAGNOSIS — Z20822 Contact with and (suspected) exposure to covid-19: Secondary | ICD-10-CM

## 2018-07-15 DIAGNOSIS — R6889 Other general symptoms and signs: Secondary | ICD-10-CM | POA: Diagnosis not present

## 2018-07-18 LAB — NOVEL CORONAVIRUS, NAA: SARS-CoV-2, NAA: NOT DETECTED

## 2018-08-07 DIAGNOSIS — Z6821 Body mass index (BMI) 21.0-21.9, adult: Secondary | ICD-10-CM | POA: Diagnosis not present

## 2018-08-07 DIAGNOSIS — R7989 Other specified abnormal findings of blood chemistry: Secondary | ICD-10-CM | POA: Diagnosis not present

## 2018-08-07 DIAGNOSIS — M061 Adult-onset Still's disease: Secondary | ICD-10-CM | POA: Diagnosis not present

## 2018-09-03 DIAGNOSIS — Z20828 Contact with and (suspected) exposure to other viral communicable diseases: Secondary | ICD-10-CM | POA: Diagnosis not present

## 2018-09-18 DIAGNOSIS — M061 Adult-onset Still's disease: Secondary | ICD-10-CM | POA: Diagnosis not present

## 2018-09-18 DIAGNOSIS — Z6822 Body mass index (BMI) 22.0-22.9, adult: Secondary | ICD-10-CM | POA: Diagnosis not present

## 2018-09-18 DIAGNOSIS — R7989 Other specified abnormal findings of blood chemistry: Secondary | ICD-10-CM | POA: Diagnosis not present

## 2018-09-19 ENCOUNTER — Encounter (HOSPITAL_COMMUNITY): Payer: Self-pay | Admitting: Infectious Disease

## 2018-09-23 ENCOUNTER — Telehealth (HOSPITAL_COMMUNITY): Payer: Self-pay

## 2018-09-23 NOTE — Telephone Encounter (Signed)
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

## 2018-09-23 NOTE — Telephone Encounter (Signed)
New message    Just an FYI. We have made several attempts to contact this patient including sending a letter to schedule or reschedule their echocardiogram. We will be removing the patient from the echo WQ.   9.11.20 mail reminder letter Stormie Ventola  8.7.20 @ 12:27pm lm on home vm - Bayleigh Loflin 7.1.20 @ 11:39am lm on home vm - Aldine Chakraborty

## 2018-10-08 DIAGNOSIS — Z23 Encounter for immunization: Secondary | ICD-10-CM | POA: Diagnosis not present

## 2018-10-30 DIAGNOSIS — M061 Adult-onset Still's disease: Secondary | ICD-10-CM | POA: Diagnosis not present

## 2018-10-30 DIAGNOSIS — Z6822 Body mass index (BMI) 22.0-22.9, adult: Secondary | ICD-10-CM | POA: Diagnosis not present

## 2018-10-30 DIAGNOSIS — Z7952 Long term (current) use of systemic steroids: Secondary | ICD-10-CM | POA: Diagnosis not present

## 2019-01-12 DIAGNOSIS — Z79899 Other long term (current) drug therapy: Secondary | ICD-10-CM | POA: Diagnosis not present

## 2019-01-12 DIAGNOSIS — M061 Adult-onset Still's disease: Secondary | ICD-10-CM | POA: Diagnosis not present

## 2019-02-23 ENCOUNTER — Ambulatory Visit: Payer: Medicare Other

## 2019-03-02 ENCOUNTER — Encounter: Payer: Self-pay | Admitting: Cardiovascular Disease

## 2019-03-02 DIAGNOSIS — I499 Cardiac arrhythmia, unspecified: Secondary | ICD-10-CM | POA: Diagnosis not present

## 2019-03-02 DIAGNOSIS — D696 Thrombocytopenia, unspecified: Secondary | ICD-10-CM | POA: Diagnosis not present

## 2019-03-02 DIAGNOSIS — Z125 Encounter for screening for malignant neoplasm of prostate: Secondary | ICD-10-CM | POA: Diagnosis not present

## 2019-03-02 DIAGNOSIS — I2510 Atherosclerotic heart disease of native coronary artery without angina pectoris: Secondary | ICD-10-CM | POA: Diagnosis not present

## 2019-04-03 DIAGNOSIS — Z20818 Contact with and (suspected) exposure to other bacterial communicable diseases: Secondary | ICD-10-CM | POA: Diagnosis not present

## 2019-04-14 DIAGNOSIS — M061 Adult-onset Still's disease: Secondary | ICD-10-CM | POA: Diagnosis not present

## 2019-04-14 DIAGNOSIS — Z20818 Contact with and (suspected) exposure to other bacterial communicable diseases: Secondary | ICD-10-CM | POA: Diagnosis not present

## 2019-04-14 DIAGNOSIS — Z7952 Long term (current) use of systemic steroids: Secondary | ICD-10-CM | POA: Diagnosis not present

## 2019-04-14 DIAGNOSIS — Z6822 Body mass index (BMI) 22.0-22.9, adult: Secondary | ICD-10-CM | POA: Diagnosis not present

## 2019-05-12 ENCOUNTER — Ambulatory Visit: Payer: Medicare Other | Admitting: Cardiovascular Disease

## 2019-05-13 ENCOUNTER — Other Ambulatory Visit: Payer: Self-pay

## 2019-05-13 ENCOUNTER — Ambulatory Visit (INDEPENDENT_AMBULATORY_CARE_PROVIDER_SITE_OTHER): Payer: Medicare Other | Admitting: Cardiovascular Disease

## 2019-05-13 ENCOUNTER — Encounter: Payer: Self-pay | Admitting: Cardiovascular Disease

## 2019-05-13 VITALS — BP 134/76 | HR 65 | Ht 74.0 in | Wt 167.2 lb

## 2019-05-13 DIAGNOSIS — R002 Palpitations: Secondary | ICD-10-CM

## 2019-05-13 DIAGNOSIS — D696 Thrombocytopenia, unspecified: Secondary | ICD-10-CM

## 2019-05-13 DIAGNOSIS — I2510 Atherosclerotic heart disease of native coronary artery without angina pectoris: Secondary | ICD-10-CM

## 2019-05-13 DIAGNOSIS — E785 Hyperlipidemia, unspecified: Secondary | ICD-10-CM

## 2019-05-13 DIAGNOSIS — M0820 Juvenile rheumatoid arthritis with systemic onset, unspecified site: Secondary | ICD-10-CM | POA: Diagnosis not present

## 2019-05-13 DIAGNOSIS — I2584 Coronary atherosclerosis due to calcified coronary lesion: Secondary | ICD-10-CM

## 2019-05-13 DIAGNOSIS — Z8249 Family history of ischemic heart disease and other diseases of the circulatory system: Secondary | ICD-10-CM | POA: Diagnosis not present

## 2019-05-13 NOTE — Patient Instructions (Signed)
Medication Instructions:  CONTINUE WITH CURRENT MEDICATIONS. NO CHANGES.  *If you need a refill on your cardiac medications before your next appointment, please call your pharmacy*   Testing/Procedures: Your physician has requested that you have an echocardiogram. Echocardiography is a painless test that uses sound waves to create images of your heart. It provides your doctor with information about the size and shape of your heart and how well your heart's chambers and valves are working. This procedure takes approximately one hour. There are no restrictions for this procedure.  [address]  Dr.Kelly has ordered a CT coronary calcium score. This test is done at [address]. This is $150 out of pocket.   Coronary CalciumScan A coronary calcium scan is an imaging test used to look for deposits of calcium and other fatty materials (plaques) in the inner lining of the blood vessels of the heart (coronary arteries). These deposits of calcium and plaques can partly clog and narrow the coronary arteries without producing any symptoms or warning signs. This puts a person at risk for a heart attack. This test can detect these deposits before symptoms develop. Tell a health care provider about:  Any allergies you have.  All medicines you are taking, including vitamins, herbs, eye drops, creams, and over-the-counter medicines.  Any problems you or family members have had with anesthetic medicines.  Any blood disorders you have.  Any surgeries you have had.  Any medical conditions you have.  Whether you are pregnant or may be pregnant. What are the risks? Generally, this is a safe procedure. However, problems may occur, including:  Harm to a pregnant woman and her unborn baby. This test involves the use of radiation. Radiation exposure can be dangerous to a pregnant woman and her unborn baby. If you are pregnant, you generally should not have this procedure  done.  Slight increase in the risk of cancer. This is because of the radiation involved in the test. What happens before the procedure? No preparation is needed for this procedure. What happens during the procedure?  You will undress and remove any jewelry around your neck or chest.  You will put on a hospital gown.  Sticky electrodes will be placed on your chest. The electrodes will be connected to an electrocardiogram (ECG) machine to record a tracing of the electrical activity of your heart.  A CT scanner will take pictures of your heart. During this time, you will be asked to lie still and hold your breath for 2-3 seconds while a picture of your heart is being taken. The procedure may vary among health care providers and hospitals. What happens after the procedure?  You can get dressed.  You can return to your normal activities.  It is up to you to get the results of your test. Ask your health care provider, or the department that is doing the test, when your results will be ready. Summary  A coronary calcium scan is an imaging test used to look for deposits of calcium and other fatty materials (plaques) in the inner lining of the blood vessels of the heart (coronary arteries).  Generally, this is a safe procedure. Tell your health care provider if you are pregnant or may be pregnant.  No preparation is needed for this procedure.  A CT scanner will take pictures of your heart.  You can return to your normal activities after the scan is done. This information is not intended to replace advice given to  you by your health care provider. Make sure you discuss any questions you have with your health care provider. Document Released: 06/23/2007 Document Revised: 11/14/2015 Document Reviewed: 11/14/2015 Elsevier Interactive Patient Education  2017 [place]: Based upon these tests!

## 2019-05-13 NOTE — Progress Notes (Signed)
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 Inda Merlin and had noticed occasional palpitations several months ago.  He was also incidentally noted to have coronary artery calcification on CT imaging and presents for initial cardiology evaluation.   Dr. Excell Seltzer denies any known cardiac history.  He has been very healthy for most of his life and has been noted to have mild thrombocytopenia of unknown etiology.  In 2020 at the start of the COVID-19 pandemic he had developed gradual onset of fevers, significant headaches, malaise, fatigue, body aches, joint pain mostly in his wrists and knees without erythema.  He had persistent fever despite doxycycline.  He ultimately underwent an extensive evaluation by Dr. Tommy Medal of infectious disease and also Dr. Irene Limbo of oncology.  He was Covid negative.  He had laboratory notable for leukopenia, reduced platelets to 50,000 in addition to elevated LDH.  After significant work-up for which she had also seen Dr. 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  Over the past year, he has been on high-dose prednisone which ultimately was weaned in the past week he finally discontinued his last dosage of 1 mg daily.  He denies any history of hypertension or chest pain.  His part of his extensive imaging studies, he was noted to have evidence for coronary artery calcification.  He has remained active and exercises at least 6 to 7 days/week including walking, biking, running, golf and going to the gym.  He denies any exertional dyspnea chest pain presyncope or syncope.  Approximately 3 months ago, he experienced an episode of palpitations which he felt was in a trigeminal rhythm that lasted  approximately 2 hours.  He denies any significant caffeine intake.  Subsequently, he has only noticed a rare isolated palpitation.  Cardiac risk factors include family history with a father who had several myocardial infarctions with his initial event age 75.  Lipid studies in February 2021 showed a total cholesterol 195 and total cholesterol 124.  There is no history of tobacco use, diabetes mellitus, or hypertension.  He now presents for initial cardiology evaluation.  Past Medical History:  Diagnosis Date  . Arthralgia of both knees 05/06/2018  . Arthritic-like pain 05/06/2018  . FUO (fever of unknown origin) 05/06/2018  . Headache 05/06/2018  . Leukopenia 05/06/2018  . Pharyngitis 05/06/2018  . Still's disease ([place]) 06/04/2018  . Transaminitis 05/06/2018  . Transaminitis 05/06/2018   Past surgical history is notable for strabismus repair, knee arthroscopy  Current Medications: Outpatient Medications Prior to Visit  Medication Sig Dispense Refill  . ASPIRIN 81 PO Take by mouth.    . predniSONE (DELTASONE) 20 MG tablet 60 mg daily x 7d, 42m daily x7d, 272mdaily x 7d 42 tablet 2  . acetaminophen (TYLENOL) 160 MG/5ML elixir Take 15 mg/kg by mouth every 4 (four) hours as needed for fever.    . doxycycline (VIBRAMYCIN) 100 MG capsule     . traMADol (ULTRAM) 50 MG tablet      No facility-administered medications prior to visit.     Allergies:   Patient has no known allergies.   Social History   Socioeconomic History  . Marital status: Married    Spouse name:  Not on file  . Number of children: Not on file  . Years of education: Not on file  . Highest education level: Not on file  Occupational History  . Not on file  Tobacco Use  . Smoking status: Never Smoker  . Smokeless tobacco: Never Used  Substance and Sexual Activity  . Alcohol use: Yes    Comment: occ  . Drug use: Never  . Sexual activity: Not on file  Other Topics Concern  . Not on file  Social History Narrative  . Not  on file   Social Determinants of Health   Financial Resource Strain:   . Difficulty of Paying Living Expenses:   Food Insecurity:   . Worried About Charity fundraiser in the Last Year:   . Arboriculturist in the Last Year:   Transportation Needs:   . Film/video editor (Medical):   Marland Kitchen Lack of Transportation (Non-Medical):   Physical Activity:   . Days of Exercise per Week:   . Minutes of Exercise per Session:   Stress:   . Feeling of Stress :   Social Connections:   . Frequency of Communication with Friends and Family:   . Frequency of Social Gatherings with Friends and Family:   . Attends Religious Services:   . Active Member of Clubs or Organizations:   . Attends Archivist Meetings:   Marland Kitchen Marital Status:     Socially he is married for 38 years.  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  He has 2 children and one grandchild.  Family History: Family history is notable that his mother died at 68 years old and had a history of lung cancer.  Father died at age 56.  He had suffered an MI and had stroke and PVD.  A brother died at age 1 with sudden death felt possibly due to pulmonary embolism.  He has a living sister age 68 who has chronic leukemia.  ROS General: Negative; No fevers, chills, or night sweats;  HEENT: Negative; No changes in vision or hearing, sinus congestion, difficulty swallowing Pulmonary: Negative; No cough, wheezing, shortness of breath, hemoptysis Cardiovascular: See HPI GI: Negative; No nausea, vomiting, diarrhea, or abdominal pain GU: Negative; No dysuria, hematuria, or difficulty voiding Musculoskeletal: Negative; no myalgias, joint pain, or weakness Hematologic/Oncology: Negative; no easy bruising, bleeding Endocrine: Negative; no heat/cold intolerance; no diabetes Neuro: Negative; no changes in balance, headaches Skin: Negative; No rashes or skin lesions Psychiatric: Negative; No  behavioral problems, depression Sleep: Negative; No snoring, daytime sleepiness, hypersomnolence, bruxism, restless legs, hypnogognic hallucinations, no cataplexy Other comprehensive 14 point system review is negative.   PHYSICAL EXAM:   VS:  BP 134/76   Pulse 65   Ht 6' 2"  (1.88 m)   Wt 167 lb 3.2 oz (75.8 kg)   SpO2 97%   BMI 21.47 kg/m     Repeat blood pressure by me was 126/76 supine and 118/76 standing.  Wt Readings from Last 3 Encounters:  05/13/19 167 lb 3.2 oz (75.8 kg)  05/21/18 165 lb (74.8 kg)  05/06/18 166 lb (75.3 kg)    General: Alert, oriented, no distress.  Skin: normal turgor, no rashes, warm and dry HEENT: Normocephalic, atraumatic. Pupils equal round and reactive to light; sclera anicteric; extraocular muscles intact;  Nose without nasal septal hypertrophy Mouth/Parynx benign;  Neck: No JVD, no carotid bruits; normal carotid upstroke Lungs: clear to ausculatation and  percussion; no wheezing or rales Chest wall: without tenderness to palpitation Heart: PMI not displaced, RRR, s1 s2 normal, trace 1/6 systolic murmur, no diastolic murmur, no rubs, gallops, thrills, or heaves Abdomen: soft, nontender; no hepatosplenomehaly, BS+; abdominal aorta nontender and not dilated by palpation. Back: no CVA tenderness Pulses 2+ Musculoskeletal: full range of motion, normal strength, no joint deformities Extremities: no clubbing cyanosis or edema, Homan's sign negative  Neurologic: grossly nonfocal; Cranial nerves grossly wnl Psychologic: Normal mood and affect   Studies/Labs Reviewed:   EKG:  EKG is ordered today.  ECG (independently read by me): Normal sinus rhythm at 65 bpm.  No ectopy.  Normal intervals.  No ST segment changes.  Recent Labs: BMP Latest Ref Rng & Units 05/21/2018 05/06/2018 05/02/2018  Glucose 65 - 99 mg/dL 93 99 96  BUN 7 - 25 mg/dL 13 17 16   Creatinine 0.70 - 1.25 mg/dL 0.91 1.00 0.95  BUN/Creat Ratio 6 - 22 (calc) NOT APPLICABLE NOT APPLICABLE -    Sodium 239 - 146 mmol/L 134(L) 134(L) 138  Potassium 3.5 - 5.3 mmol/L 4.4 4.7 4.1  Chloride 98 - 110 mmol/L 101 100 102  CO2 20 - 32 mmol/L 28 26 27   Calcium 8.6 - 10.3 mg/dL 8.4(L) 8.6 8.6(L)     Hepatic Function Latest Ref Rng & Units 05/21/2018 05/06/2018 05/02/2018  Total Protein 6.1 - 8.1 g/dL 6.8 6.4 6.8  Albumin 3.5 - 5.0 g/dL - - 3.3(L)  AST 10 - 35 U/L 108(H) 43(H) 45(H)  ALT 9 - 46 U/L 118(H) 28 48(H)  Alk Phosphatase 38 - 126 U/L - - 70  Total Bilirubin 0.2 - 1.2 mg/dL 0.5 0.4 0.4    CBC Latest Ref Rng & Units 05/21/2018 05/15/2018 05/14/2018  WBC 3.8 - 10.8 Thousand/uL 1.5(L) 1.8(L) 2.0(L)  Hemoglobin 13.2 - 17.1 g/dL 10.2(L) 11.6(L) 10.7(L)  Hematocrit 38.5 - 50.0 % 30.5(L) 35.1(L) 31.6(L)  Platelets 140 - 400 Thousand/uL 65(L) 47(L) 44(L)   Lab Results  Component Value Date   MCV 88.7 05/21/2018   MCV 90.5 05/15/2018   MCV 88.3 05/14/2018   No results found for: TSH No results found for: HGBA1C   BNP No results found for: BNP  ProBNP No results found for: PROBNP   Lipid Panel  No results found for: CHOL, TRIG, HDL, CHOLHDL, VLDL, LDLCALC, LDLDIRECT, LABVLDL   Lipid studies from Dr. Inda Merlin on March 02, 2019: Total cholesterol 195, HDL cholesterol 58, triglycerides 70, non-HDL 137, LDL 124.   RADIOLOGY: No results found.   Additional studies/ records that were reviewed today include:  I reviewed the medical records of Dr. Josetta Huddle, Dr. Rhina Brackett Dam, and Dr. Irene Limbo.  Imaging studies were reviewed   ASSESSMENT:    1. Coronary artery calcification   2. Palpitations   3. Mild hyperlipidemia   4. Family history of heart disease   5. Still's disease ([place])   6. 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  In 2020 he developed fever of unknown etiology with  headache, significant pain bilaterally in his wrists and knees with leukopenia and significant thrombocytopenia.  He ultimately was diagnosed by exclusion as having adult onset Still's disease and was treated with high-dose prednisone slowly tapered over a year with his last 1 mg dose being discontinued this past week.  He has remained active and denies any exertionally precipitated chest pain or change in exercise tolerance.  Several months ago he experienced an episode of increasing palpitations which appeared to occur almost in a trigeminal rhythm which lasted for several hours and ultimately resolved.  Subsequently he has noticed only rare intermittent isolated palpitations.  He denies any presyncope or syncope.  He does have family history for CAD with his father suffered his first myocardial infarction at age 48.  He has a history of mildly increased LDL cholesterol at 124 and has never been on therapy.  CT imaging has demonstrated coronary artery calcification in the left coronary system.  He continues to exercise regularly without restriction.  I am initially recommending he undergo a coronary calcium score for quantification of his calcification noted on CT imaging.  He is not having any chest pain and at this point I do not feel coronary CTA is warranted.  I am also scheduling him for 2D echo Doppler study to assess systolic and diastolic function as well as chamber dimensions and valvular architecture.  I discussed consideration for initiation of lipid-lowering therapy depending upon the result of his coronary calcium score.  Presently, he is not having any palpitations.  If these were to recur a Zio patch or event monitoring may be helpful.  I will contact him with the results of the above studies and additional recommended recommendations and follow-up will be made at that time.   Medication Adjustments/Labs and Tests Ordered: Current medicines are reviewed at length with the patient today.   Concerns regarding medicines are outlined above.  Medication changes, Labs and Tests ordered today are listed in the Patient Instructions below. Patient Instructions  Medication Instructions:  CONTINUE WITH CURRENT MEDICATIONS. NO CHANGES.  *If you need a refill on your cardiac medications before your next appointment, please call your pharmacy*   Testing/Procedures: Your physician has requested that you have an echocardiogram. Echocardiography is a painless test that uses sound waves to create images of your heart. It provides your doctor with information about the size and shape of your heart and how well your heart's chambers and valves are working. This procedure takes approximately one hour. There are no restrictions for this procedure.  [address]  Dr.Cierra Rothgeb has ordered a CT coronary calcium score. This test is done at [address]. This is $150 out of pocket.   Coronary CalciumScan A coronary calcium scan is an imaging test used to look for deposits of calcium and other fatty materials (plaques) in the inner lining of the blood vessels of the heart (coronary arteries). These deposits of calcium and plaques can partly clog and narrow the coronary arteries without producing any symptoms or warning signs. This puts a person at risk for a heart attack. This test can detect these deposits before symptoms develop. Tell a health care provider about:  Any allergies you have.  All medicines you are taking, including vitamins, herbs, eye drops, creams, and over-the-counter medicines.  Any problems you or family members have had with anesthetic medicines.  Any blood disorders you have.  Any surgeries you have had.  Any medical conditions you have.  Whether you are pregnant or may be pregnant. What are the risks? Generally, this is a safe procedure. However, problems may occur, including:  Harm to a pregnant woman and her unborn baby. This test involves the  use of radiation. Radiation exposure can be dangerous to a pregnant woman and her  unborn baby. If you are pregnant, you generally should not have this procedure done.  Slight increase in the risk of cancer. This is because of the radiation involved in the test. What happens before the procedure? No preparation is needed for this procedure. What happens during the procedure?  You will undress and remove any jewelry around your neck or chest.  You will put on a hospital gown.  Sticky electrodes will be placed on your chest. The electrodes will be connected to an electrocardiogram (ECG) machine to record a tracing of the electrical activity of your heart.  A CT scanner will take pictures of your heart. During this time, you will be asked to lie still and hold your breath for 2-3 seconds while a picture of your heart is being taken. The procedure may vary among health care providers and hospitals. What happens after the procedure?  You can get dressed.  You can return to your normal activities.  It is up to you to get the results of your test. Ask your health care provider, or the department that is doing the test, when your results will be ready. Summary  A coronary calcium scan is an imaging test used to look for deposits of calcium and other fatty materials (plaques) in the inner lining of the blood vessels of the heart (coronary arteries).  Generally, this is a safe procedure. Tell your health care provider if you are pregnant or may be pregnant.  No preparation is needed for this procedure.  A CT scanner will take pictures of your heart.  You can return to your normal activities after the scan is done. This information is not intended to replace advice given to you by your health care provider. Make sure you discuss any questions you have with your health care provider. Document Released: 06/23/2007 Document Revised: 11/14/2015 Document Reviewed: 11/14/2015 Elsevier Interactive  Patient Education  2017 [place]: Based upon these tests!     Signed, Shelva Majestic, MD  05/20/2019 8:31 AM    [facility] [address] [place], [place], [place]  [address] Phone: [phone]

## 2019-05-20 ENCOUNTER — Encounter: Payer: Self-pay | Admitting: Cardiovascular Disease

## 2019-05-27 ENCOUNTER — Ambulatory Visit (INDEPENDENT_AMBULATORY_CARE_PROVIDER_SITE_OTHER)
Admission: RE | Admit: 2019-05-27 | Discharge: 2019-05-27 | Disposition: A | Discharge status: Home / Self Care | Payer: Self-pay | Source: Ambulatory Visit | Attending: Cardiovascular Disease | Admitting: Cardiovascular Disease

## 2019-05-27 ENCOUNTER — Other Ambulatory Visit: Payer: Self-pay

## 2019-05-27 DIAGNOSIS — I2584 Coronary atherosclerosis due to calcified coronary lesion: Secondary | ICD-10-CM

## 2019-05-27 DIAGNOSIS — R911 Solitary pulmonary nodule: Secondary | ICD-10-CM | POA: Diagnosis not present

## 2019-05-27 DIAGNOSIS — I2510 Atherosclerotic heart disease of native coronary artery without angina pectoris: Secondary | ICD-10-CM

## 2019-06-01 ENCOUNTER — Other Ambulatory Visit: Payer: Self-pay

## 2019-06-01 ENCOUNTER — Ambulatory Visit (HOSPITAL_COMMUNITY): Payer: Medicare Other | Attending: Cardiology

## 2019-06-01 DIAGNOSIS — I2510 Atherosclerotic heart disease of native coronary artery without angina pectoris: Secondary | ICD-10-CM | POA: Insufficient documentation

## 2019-06-01 DIAGNOSIS — I2584 Coronary atherosclerosis due to calcified coronary lesion: Secondary | ICD-10-CM | POA: Insufficient documentation

## 2019-06-04 ENCOUNTER — Telehealth: Payer: Self-pay

## 2019-06-04 ENCOUNTER — Other Ambulatory Visit: Payer: Self-pay

## 2019-06-04 DIAGNOSIS — Z79899 Other long term (current) drug therapy: Secondary | ICD-10-CM

## 2019-06-04 DIAGNOSIS — E785 Hyperlipidemia, unspecified: Secondary | ICD-10-CM

## 2019-06-04 MED ORDER — ROSUVASTATIN CALCIUM 10 MG PO TABS: 10.0000 mg | ORAL_TABLET | Freq: Every day | ORAL | 3 refills | Status: AC

## 2019-06-04 NOTE — Progress Notes (Signed)
Prescription for rosuvastatin 10mg  sent to pt's preferred pharmacy.

## 2019-06-04 NOTE — Telephone Encounter (Signed)
Spoke with pt who reviewed results in mychart.aware of rosuvastatin 10mg  addition to his medication regimen. Would like to know when he should be seen in follow up.  Will defer back to Dr.Kelly.

## 2019-06-05 NOTE — Telephone Encounter (Signed)
Since rosuvastatin was initiated, repeat chemistry and lipid panel in 3 months with follow-up office visit in 3 to 4 months

## 2019-06-09 DIAGNOSIS — H35372 Puckering of macula, left eye: Secondary | ICD-10-CM | POA: Diagnosis not present

## 2019-06-09 DIAGNOSIS — H2513 Age-related nuclear cataract, bilateral: Secondary | ICD-10-CM | POA: Diagnosis not present

## 2019-06-09 DIAGNOSIS — H40013 Open angle with borderline findings, low risk, bilateral: Secondary | ICD-10-CM | POA: Diagnosis not present

## 2019-06-09 DIAGNOSIS — H43812 Vitreous degeneration, left eye: Secondary | ICD-10-CM | POA: Diagnosis not present

## 2019-06-09 NOTE — Addendum Note (Signed)
Addended by: Valeta Harms on: 06/09/2019 10:47 AM   Modules accepted: Orders

## 2019-06-09 NOTE — Telephone Encounter (Addendum)
Left detailed message on pt's cell phone, notified that dr.kelly would like repeat labs in 3 months and for him to follow up in 3-4 months. Notified if he had any other questions to contact our office. Will send a mychart message as well.

## 2019-07-08 DIAGNOSIS — H43812 Vitreous degeneration, left eye: Secondary | ICD-10-CM | POA: Diagnosis not present

## 2019-07-21 DIAGNOSIS — M061 Adult-onset Still's disease: Secondary | ICD-10-CM | POA: Diagnosis not present

## 2019-07-21 DIAGNOSIS — R7989 Other specified abnormal findings of blood chemistry: Secondary | ICD-10-CM | POA: Diagnosis not present

## 2019-07-21 DIAGNOSIS — Z79899 Other long term (current) drug therapy: Secondary | ICD-10-CM | POA: Diagnosis not present

## 2019-08-31 ENCOUNTER — Other Ambulatory Visit: Payer: Medicare Other

## 2019-08-31 ENCOUNTER — Other Ambulatory Visit: Payer: Self-pay

## 2019-08-31 DIAGNOSIS — Z20822 Contact with and (suspected) exposure to covid-19: Secondary | ICD-10-CM | POA: Diagnosis not present

## 2019-08-31 NOTE — Telephone Encounter (Signed)
Spoke with pt and has had several issues over the course of a few months 1 . 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 2 weeks ago visited granddaughter and She had URI now pt has URI Per pt notes SOB with little activity "walking mailbox"also over the last week has had 3-4 quick episodes of CP and goes away with rest Per pt a month ago was biking 15 miles and 18 holes of golf without difficulty  Pt is going to f/u with Rheumatologists to get f/u blood work and possible covid test  Will forward to Dr Claiborne Billings for review and recommendations and pt will call back if all other testing is normal .Adonis Housekeeper

## 2019-09-02 DIAGNOSIS — Z7952 Long term (current) use of systemic steroids: Secondary | ICD-10-CM | POA: Diagnosis not present

## 2019-09-02 DIAGNOSIS — Z6821 Body mass index (BMI) 21.0-21.9, adult: Secondary | ICD-10-CM | POA: Diagnosis not present

## 2019-09-02 DIAGNOSIS — M2550 Pain in unspecified joint: Secondary | ICD-10-CM | POA: Diagnosis not present

## 2019-09-02 DIAGNOSIS — R0600 Dyspnea, unspecified: Secondary | ICD-10-CM | POA: Diagnosis not present

## 2019-09-02 DIAGNOSIS — R21 Rash and other nonspecific skin eruption: Secondary | ICD-10-CM | POA: Diagnosis not present

## 2019-09-02 DIAGNOSIS — M061 Adult-onset Still's disease: Secondary | ICD-10-CM | POA: Diagnosis not present

## 2019-09-02 LAB — SARS-COV-2, NAA 2 DAY TAT

## 2019-09-02 LAB — NOVEL CORONAVIRUS, NAA: SARS-CoV-2, NAA: NOT DETECTED

## 2019-09-03 DIAGNOSIS — N179 Acute kidney failure, unspecified: Secondary | ICD-10-CM | POA: Diagnosis present

## 2019-09-03 DIAGNOSIS — R61 Generalized hyperhidrosis: Secondary | ICD-10-CM | POA: Diagnosis present

## 2019-09-03 DIAGNOSIS — E790 Hyperuricemia without signs of inflammatory arthritis and tophaceous disease: Secondary | ICD-10-CM | POA: Diagnosis present

## 2019-09-03 DIAGNOSIS — I6203 Nontraumatic chronic subdural hemorrhage: Secondary | ICD-10-CM | POA: Diagnosis present

## 2019-09-03 DIAGNOSIS — N4889 Other specified disorders of penis: Secondary | ICD-10-CM | POA: Diagnosis present

## 2019-09-03 DIAGNOSIS — R590 Localized enlarged lymph nodes: Secondary | ICD-10-CM | POA: Diagnosis not present

## 2019-09-03 DIAGNOSIS — E876 Hypokalemia: Secondary | ICD-10-CM | POA: Diagnosis not present

## 2019-09-03 DIAGNOSIS — R339 Retention of urine, unspecified: Secondary | ICD-10-CM | POA: Diagnosis not present

## 2019-09-03 DIAGNOSIS — I081 Rheumatic disorders of both mitral and tricuspid valves: Secondary | ICD-10-CM | POA: Diagnosis not present

## 2019-09-03 DIAGNOSIS — C9200 Acute myeloblastic leukemia, not having achieved remission: Secondary | ICD-10-CM | POA: Diagnosis present

## 2019-09-03 DIAGNOSIS — R7989 Other specified abnormal findings of blood chemistry: Secondary | ICD-10-CM | POA: Diagnosis not present

## 2019-09-03 DIAGNOSIS — R5381 Other malaise: Secondary | ICD-10-CM | POA: Diagnosis present

## 2019-09-03 DIAGNOSIS — D61818 Other pancytopenia: Secondary | ICD-10-CM | POA: Diagnosis not present

## 2019-09-03 DIAGNOSIS — L040 Acute lymphadenitis of face, head and neck: Secondary | ICD-10-CM | POA: Diagnosis not present

## 2019-09-03 DIAGNOSIS — D709 Neutropenia, unspecified: Secondary | ICD-10-CM | POA: Diagnosis not present

## 2019-09-03 DIAGNOSIS — D61810 Antineoplastic chemotherapy induced pancytopenia: Secondary | ICD-10-CM | POA: Diagnosis not present

## 2019-09-03 DIAGNOSIS — R531 Weakness: Secondary | ICD-10-CM | POA: Diagnosis not present

## 2019-09-03 DIAGNOSIS — R5081 Fever presenting with conditions classified elsewhere: Secondary | ICD-10-CM | POA: Diagnosis not present

## 2019-09-03 DIAGNOSIS — I6201 Nontraumatic acute subdural hemorrhage: Secondary | ICD-10-CM | POA: Diagnosis present

## 2019-09-03 DIAGNOSIS — M061 Adult-onset Still's disease: Secondary | ICD-10-CM | POA: Diagnosis present

## 2019-09-03 DIAGNOSIS — R159 Full incontinence of feces: Secondary | ICD-10-CM | POA: Diagnosis not present

## 2019-09-03 DIAGNOSIS — K5289 Other specified noninfective gastroenteritis and colitis: Secondary | ICD-10-CM | POA: Diagnosis not present

## 2019-09-03 DIAGNOSIS — C9500 Acute leukemia of unspecified cell type not having achieved remission: Secondary | ICD-10-CM | POA: Diagnosis not present

## 2019-09-03 DIAGNOSIS — D696 Thrombocytopenia, unspecified: Secondary | ICD-10-CM | POA: Diagnosis not present

## 2019-09-03 DIAGNOSIS — B9689 Other specified bacterial agents as the cause of diseases classified elsewhere: Secondary | ICD-10-CM | POA: Diagnosis not present

## 2019-09-03 DIAGNOSIS — K529 Noninfective gastroenteritis and colitis, unspecified: Secondary | ICD-10-CM | POA: Diagnosis not present

## 2019-09-03 DIAGNOSIS — B9562 Methicillin resistant Staphylococcus aureus infection as the cause of diseases classified elsewhere: Secondary | ICD-10-CM | POA: Diagnosis not present

## 2019-09-03 DIAGNOSIS — R9431 Abnormal electrocardiogram [ECG] [EKG]: Secondary | ICD-10-CM | POA: Diagnosis not present

## 2019-09-03 DIAGNOSIS — D531 Other megaloblastic anemias, not elsewhere classified: Secondary | ICD-10-CM | POA: Diagnosis not present

## 2019-09-03 DIAGNOSIS — R509 Fever, unspecified: Secondary | ICD-10-CM | POA: Diagnosis not present

## 2019-09-03 DIAGNOSIS — I491 Atrial premature depolarization: Secondary | ICD-10-CM | POA: Diagnosis not present

## 2019-09-03 DIAGNOSIS — D72829 Elevated white blood cell count, unspecified: Secondary | ICD-10-CM | POA: Diagnosis not present

## 2019-09-03 DIAGNOSIS — T451X5A Adverse effect of antineoplastic and immunosuppressive drugs, initial encounter: Secondary | ICD-10-CM | POA: Diagnosis not present

## 2019-09-03 DIAGNOSIS — D6959 Other secondary thrombocytopenia: Secondary | ICD-10-CM | POA: Diagnosis present

## 2019-09-03 DIAGNOSIS — K649 Unspecified hemorrhoids: Secondary | ICD-10-CM | POA: Diagnosis present

## 2019-09-03 DIAGNOSIS — R140 Abdominal distension (gaseous): Secondary | ICD-10-CM | POA: Diagnosis not present

## 2019-09-03 DIAGNOSIS — R0609 Other forms of dyspnea: Secondary | ICD-10-CM | POA: Diagnosis not present

## 2019-09-03 DIAGNOSIS — B9561 Methicillin susceptible Staphylococcus aureus infection as the cause of diseases classified elsewhere: Secondary | ICD-10-CM | POA: Diagnosis not present

## 2019-09-03 DIAGNOSIS — D649 Anemia, unspecified: Secondary | ICD-10-CM | POA: Diagnosis not present

## 2019-09-03 DIAGNOSIS — H9319 Tinnitus, unspecified ear: Secondary | ICD-10-CM | POA: Diagnosis present

## 2019-09-03 DIAGNOSIS — L03211 Cellulitis of face: Secondary | ICD-10-CM | POA: Diagnosis present

## 2019-09-03 DIAGNOSIS — D6489 Other specified anemias: Secondary | ICD-10-CM | POA: Diagnosis not present

## 2019-09-03 DIAGNOSIS — I2510 Atherosclerotic heart disease of native coronary artery without angina pectoris: Secondary | ICD-10-CM | POA: Diagnosis present

## 2019-09-03 DIAGNOSIS — I6200 Nontraumatic subdural hemorrhage, unspecified: Secondary | ICD-10-CM | POA: Diagnosis not present

## 2019-09-03 DIAGNOSIS — R918 Other nonspecific abnormal finding of lung field: Secondary | ICD-10-CM | POA: Diagnosis not present

## 2019-09-03 DIAGNOSIS — N5089 Other specified disorders of the male genital organs: Secondary | ICD-10-CM | POA: Diagnosis present

## 2019-09-03 DIAGNOSIS — E883 Tumor lysis syndrome: Secondary | ICD-10-CM | POA: Diagnosis present

## 2019-09-03 DIAGNOSIS — Z5111 Encounter for antineoplastic chemotherapy: Secondary | ICD-10-CM | POA: Diagnosis not present

## 2019-09-03 DIAGNOSIS — D539 Nutritional anemia, unspecified: Secondary | ICD-10-CM | POA: Diagnosis not present

## 2019-09-03 DIAGNOSIS — L0390 Cellulitis, unspecified: Secondary | ICD-10-CM | POA: Diagnosis not present

## 2019-09-03 DIAGNOSIS — K5900 Constipation, unspecified: Secondary | ICD-10-CM | POA: Diagnosis not present

## 2019-09-03 DIAGNOSIS — K122 Cellulitis and abscess of mouth: Secondary | ICD-10-CM | POA: Diagnosis not present

## 2019-09-04 DIAGNOSIS — I081 Rheumatic disorders of both mitral and tricuspid valves: Secondary | ICD-10-CM | POA: Diagnosis not present

## 2019-09-04 DIAGNOSIS — D696 Thrombocytopenia, unspecified: Secondary | ICD-10-CM | POA: Diagnosis not present

## 2019-09-04 DIAGNOSIS — N179 Acute kidney failure, unspecified: Secondary | ICD-10-CM | POA: Diagnosis not present

## 2019-09-04 DIAGNOSIS — M061 Adult-onset Still's disease: Secondary | ICD-10-CM | POA: Diagnosis not present

## 2019-09-04 DIAGNOSIS — R509 Fever, unspecified: Secondary | ICD-10-CM | POA: Diagnosis not present

## 2019-09-04 DIAGNOSIS — C9200 Acute myeloblastic leukemia, not having achieved remission: Secondary | ICD-10-CM | POA: Diagnosis not present

## 2019-09-04 DIAGNOSIS — D531 Other megaloblastic anemias, not elsewhere classified: Secondary | ICD-10-CM | POA: Diagnosis not present

## 2019-09-05 DIAGNOSIS — C9200 Acute myeloblastic leukemia, not having achieved remission: Secondary | ICD-10-CM | POA: Diagnosis not present

## 2019-09-05 DIAGNOSIS — D531 Other megaloblastic anemias, not elsewhere classified: Secondary | ICD-10-CM | POA: Diagnosis not present

## 2019-09-05 DIAGNOSIS — M061 Adult-onset Still's disease: Secondary | ICD-10-CM | POA: Diagnosis not present

## 2019-09-05 DIAGNOSIS — D696 Thrombocytopenia, unspecified: Secondary | ICD-10-CM | POA: Diagnosis not present

## 2019-09-05 DIAGNOSIS — N179 Acute kidney failure, unspecified: Secondary | ICD-10-CM | POA: Diagnosis not present

## 2019-09-05 DIAGNOSIS — R509 Fever, unspecified: Secondary | ICD-10-CM | POA: Diagnosis not present

## 2019-09-06 DIAGNOSIS — N179 Acute kidney failure, unspecified: Secondary | ICD-10-CM | POA: Diagnosis not present

## 2019-09-06 DIAGNOSIS — M061 Adult-onset Still's disease: Secondary | ICD-10-CM | POA: Diagnosis not present

## 2019-09-06 DIAGNOSIS — R509 Fever, unspecified: Secondary | ICD-10-CM | POA: Diagnosis not present

## 2019-09-06 DIAGNOSIS — D696 Thrombocytopenia, unspecified: Secondary | ICD-10-CM | POA: Diagnosis not present

## 2019-09-06 DIAGNOSIS — D531 Other megaloblastic anemias, not elsewhere classified: Secondary | ICD-10-CM | POA: Diagnosis not present

## 2019-09-06 DIAGNOSIS — C9200 Acute myeloblastic leukemia, not having achieved remission: Secondary | ICD-10-CM | POA: Diagnosis not present

## 2019-09-07 DIAGNOSIS — C9200 Acute myeloblastic leukemia, not having achieved remission: Secondary | ICD-10-CM | POA: Diagnosis not present

## 2019-09-08 DIAGNOSIS — I6200 Nontraumatic subdural hemorrhage, unspecified: Secondary | ICD-10-CM | POA: Diagnosis not present

## 2019-09-08 DIAGNOSIS — R590 Localized enlarged lymph nodes: Secondary | ICD-10-CM | POA: Diagnosis not present

## 2019-09-08 DIAGNOSIS — C9200 Acute myeloblastic leukemia, not having achieved remission: Secondary | ICD-10-CM | POA: Diagnosis not present

## 2019-09-09 DIAGNOSIS — L03211 Cellulitis of face: Secondary | ICD-10-CM | POA: Diagnosis not present

## 2019-09-09 DIAGNOSIS — D696 Thrombocytopenia, unspecified: Secondary | ICD-10-CM | POA: Diagnosis not present

## 2019-09-09 DIAGNOSIS — M061 Adult-onset Still's disease: Secondary | ICD-10-CM | POA: Diagnosis not present

## 2019-09-09 DIAGNOSIS — D6489 Other specified anemias: Secondary | ICD-10-CM | POA: Diagnosis not present

## 2019-09-09 DIAGNOSIS — D61810 Antineoplastic chemotherapy induced pancytopenia: Secondary | ICD-10-CM | POA: Diagnosis not present

## 2019-09-09 DIAGNOSIS — I6203 Nontraumatic chronic subdural hemorrhage: Secondary | ICD-10-CM | POA: Diagnosis not present

## 2019-09-09 DIAGNOSIS — C9200 Acute myeloblastic leukemia, not having achieved remission: Secondary | ICD-10-CM | POA: Diagnosis not present

## 2019-09-09 DIAGNOSIS — L040 Acute lymphadenitis of face, head and neck: Secondary | ICD-10-CM | POA: Diagnosis not present

## 2019-09-10 DIAGNOSIS — D709 Neutropenia, unspecified: Secondary | ICD-10-CM | POA: Diagnosis not present

## 2019-09-10 DIAGNOSIS — D6489 Other specified anemias: Secondary | ICD-10-CM | POA: Diagnosis not present

## 2019-09-10 DIAGNOSIS — I6203 Nontraumatic chronic subdural hemorrhage: Secondary | ICD-10-CM | POA: Diagnosis not present

## 2019-09-10 DIAGNOSIS — M061 Adult-onset Still's disease: Secondary | ICD-10-CM | POA: Diagnosis not present

## 2019-09-10 DIAGNOSIS — D61810 Antineoplastic chemotherapy induced pancytopenia: Secondary | ICD-10-CM | POA: Diagnosis not present

## 2019-09-10 DIAGNOSIS — R5081 Fever presenting with conditions classified elsewhere: Secondary | ICD-10-CM | POA: Diagnosis not present

## 2019-09-10 DIAGNOSIS — L040 Acute lymphadenitis of face, head and neck: Secondary | ICD-10-CM | POA: Diagnosis not present

## 2019-09-10 DIAGNOSIS — L03211 Cellulitis of face: Secondary | ICD-10-CM | POA: Diagnosis not present

## 2019-09-10 DIAGNOSIS — D696 Thrombocytopenia, unspecified: Secondary | ICD-10-CM | POA: Diagnosis not present

## 2019-09-10 DIAGNOSIS — C9200 Acute myeloblastic leukemia, not having achieved remission: Secondary | ICD-10-CM | POA: Diagnosis not present

## 2019-09-11 DIAGNOSIS — C9200 Acute myeloblastic leukemia, not having achieved remission: Secondary | ICD-10-CM | POA: Diagnosis not present

## 2019-09-11 DIAGNOSIS — I6203 Nontraumatic chronic subdural hemorrhage: Secondary | ICD-10-CM | POA: Diagnosis not present

## 2019-09-11 DIAGNOSIS — M061 Adult-onset Still's disease: Secondary | ICD-10-CM | POA: Diagnosis not present

## 2019-09-11 DIAGNOSIS — L03211 Cellulitis of face: Secondary | ICD-10-CM | POA: Diagnosis not present

## 2019-09-11 DIAGNOSIS — D61810 Antineoplastic chemotherapy induced pancytopenia: Secondary | ICD-10-CM | POA: Diagnosis not present

## 2019-09-11 DIAGNOSIS — L040 Acute lymphadenitis of face, head and neck: Secondary | ICD-10-CM | POA: Diagnosis not present

## 2019-09-11 DIAGNOSIS — D6489 Other specified anemias: Secondary | ICD-10-CM | POA: Diagnosis not present

## 2019-09-11 DIAGNOSIS — D696 Thrombocytopenia, unspecified: Secondary | ICD-10-CM | POA: Diagnosis not present

## 2019-09-12 DIAGNOSIS — C9200 Acute myeloblastic leukemia, not having achieved remission: Secondary | ICD-10-CM | POA: Diagnosis not present

## 2019-09-12 DIAGNOSIS — I6203 Nontraumatic chronic subdural hemorrhage: Secondary | ICD-10-CM | POA: Diagnosis not present

## 2019-09-12 DIAGNOSIS — D6489 Other specified anemias: Secondary | ICD-10-CM | POA: Diagnosis not present

## 2019-09-12 DIAGNOSIS — L03211 Cellulitis of face: Secondary | ICD-10-CM | POA: Diagnosis not present

## 2019-09-12 DIAGNOSIS — D696 Thrombocytopenia, unspecified: Secondary | ICD-10-CM | POA: Diagnosis not present

## 2019-09-12 DIAGNOSIS — D61810 Antineoplastic chemotherapy induced pancytopenia: Secondary | ICD-10-CM | POA: Diagnosis not present

## 2019-09-12 DIAGNOSIS — L040 Acute lymphadenitis of face, head and neck: Secondary | ICD-10-CM | POA: Diagnosis not present

## 2019-09-12 DIAGNOSIS — M061 Adult-onset Still's disease: Secondary | ICD-10-CM | POA: Diagnosis not present

## 2019-09-13 DIAGNOSIS — D696 Thrombocytopenia, unspecified: Secondary | ICD-10-CM | POA: Diagnosis not present

## 2019-09-13 DIAGNOSIS — D61810 Antineoplastic chemotherapy induced pancytopenia: Secondary | ICD-10-CM | POA: Diagnosis not present

## 2019-09-13 DIAGNOSIS — M061 Adult-onset Still's disease: Secondary | ICD-10-CM | POA: Diagnosis not present

## 2019-09-13 DIAGNOSIS — D6489 Other specified anemias: Secondary | ICD-10-CM | POA: Diagnosis not present

## 2019-09-13 DIAGNOSIS — L03211 Cellulitis of face: Secondary | ICD-10-CM | POA: Diagnosis not present

## 2019-09-13 DIAGNOSIS — L040 Acute lymphadenitis of face, head and neck: Secondary | ICD-10-CM | POA: Diagnosis not present

## 2019-09-13 DIAGNOSIS — I6203 Nontraumatic chronic subdural hemorrhage: Secondary | ICD-10-CM | POA: Diagnosis not present

## 2019-09-13 DIAGNOSIS — C9200 Acute myeloblastic leukemia, not having achieved remission: Secondary | ICD-10-CM | POA: Diagnosis not present

## 2019-09-14 DIAGNOSIS — M061 Adult-onset Still's disease: Secondary | ICD-10-CM | POA: Diagnosis not present

## 2019-09-14 DIAGNOSIS — C9200 Acute myeloblastic leukemia, not having achieved remission: Secondary | ICD-10-CM | POA: Diagnosis not present

## 2019-09-14 DIAGNOSIS — D696 Thrombocytopenia, unspecified: Secondary | ICD-10-CM | POA: Diagnosis not present

## 2019-09-14 DIAGNOSIS — D6489 Other specified anemias: Secondary | ICD-10-CM | POA: Diagnosis not present

## 2019-09-14 DIAGNOSIS — D61810 Antineoplastic chemotherapy induced pancytopenia: Secondary | ICD-10-CM | POA: Diagnosis not present

## 2019-09-14 DIAGNOSIS — L040 Acute lymphadenitis of face, head and neck: Secondary | ICD-10-CM | POA: Diagnosis not present

## 2019-09-14 DIAGNOSIS — L03211 Cellulitis of face: Secondary | ICD-10-CM | POA: Diagnosis not present

## 2019-09-14 DIAGNOSIS — I6203 Nontraumatic chronic subdural hemorrhage: Secondary | ICD-10-CM | POA: Diagnosis not present

## 2019-09-15 DIAGNOSIS — D61810 Antineoplastic chemotherapy induced pancytopenia: Secondary | ICD-10-CM | POA: Diagnosis not present

## 2019-09-15 DIAGNOSIS — M061 Adult-onset Still's disease: Secondary | ICD-10-CM | POA: Diagnosis not present

## 2019-09-15 DIAGNOSIS — I6203 Nontraumatic chronic subdural hemorrhage: Secondary | ICD-10-CM | POA: Diagnosis not present

## 2019-09-15 DIAGNOSIS — D696 Thrombocytopenia, unspecified: Secondary | ICD-10-CM | POA: Diagnosis not present

## 2019-09-15 DIAGNOSIS — C9200 Acute myeloblastic leukemia, not having achieved remission: Secondary | ICD-10-CM | POA: Diagnosis not present

## 2019-09-15 DIAGNOSIS — D6489 Other specified anemias: Secondary | ICD-10-CM | POA: Diagnosis not present

## 2019-09-15 DIAGNOSIS — L03211 Cellulitis of face: Secondary | ICD-10-CM | POA: Diagnosis not present

## 2019-09-15 DIAGNOSIS — L040 Acute lymphadenitis of face, head and neck: Secondary | ICD-10-CM | POA: Diagnosis not present

## 2019-09-16 DIAGNOSIS — M061 Adult-onset Still's disease: Secondary | ICD-10-CM | POA: Diagnosis not present

## 2019-09-16 DIAGNOSIS — B9689 Other specified bacterial agents as the cause of diseases classified elsewhere: Secondary | ICD-10-CM | POA: Diagnosis not present

## 2019-09-16 DIAGNOSIS — R339 Retention of urine, unspecified: Secondary | ICD-10-CM | POA: Diagnosis not present

## 2019-09-16 DIAGNOSIS — D531 Other megaloblastic anemias, not elsewhere classified: Secondary | ICD-10-CM | POA: Diagnosis not present

## 2019-09-16 DIAGNOSIS — D696 Thrombocytopenia, unspecified: Secondary | ICD-10-CM | POA: Diagnosis not present

## 2019-09-16 DIAGNOSIS — L0390 Cellulitis, unspecified: Secondary | ICD-10-CM | POA: Diagnosis not present

## 2019-09-16 DIAGNOSIS — L040 Acute lymphadenitis of face, head and neck: Secondary | ICD-10-CM | POA: Diagnosis not present

## 2019-09-16 DIAGNOSIS — D649 Anemia, unspecified: Secondary | ICD-10-CM | POA: Diagnosis not present

## 2019-09-16 DIAGNOSIS — B9562 Methicillin resistant Staphylococcus aureus infection as the cause of diseases classified elsewhere: Secondary | ICD-10-CM | POA: Diagnosis not present

## 2019-09-16 DIAGNOSIS — I6203 Nontraumatic chronic subdural hemorrhage: Secondary | ICD-10-CM | POA: Diagnosis not present

## 2019-09-16 DIAGNOSIS — C9200 Acute myeloblastic leukemia, not having achieved remission: Secondary | ICD-10-CM | POA: Diagnosis not present

## 2019-09-17 DIAGNOSIS — M061 Adult-onset Still's disease: Secondary | ICD-10-CM | POA: Diagnosis not present

## 2019-09-17 DIAGNOSIS — L040 Acute lymphadenitis of face, head and neck: Secondary | ICD-10-CM | POA: Diagnosis not present

## 2019-09-17 DIAGNOSIS — I6203 Nontraumatic chronic subdural hemorrhage: Secondary | ICD-10-CM | POA: Diagnosis not present

## 2019-09-17 DIAGNOSIS — D696 Thrombocytopenia, unspecified: Secondary | ICD-10-CM | POA: Diagnosis not present

## 2019-09-17 DIAGNOSIS — B9562 Methicillin resistant Staphylococcus aureus infection as the cause of diseases classified elsewhere: Secondary | ICD-10-CM | POA: Diagnosis not present

## 2019-09-17 DIAGNOSIS — B9689 Other specified bacterial agents as the cause of diseases classified elsewhere: Secondary | ICD-10-CM | POA: Diagnosis not present

## 2019-09-17 DIAGNOSIS — L0390 Cellulitis, unspecified: Secondary | ICD-10-CM | POA: Diagnosis not present

## 2019-09-17 DIAGNOSIS — D531 Other megaloblastic anemias, not elsewhere classified: Secondary | ICD-10-CM | POA: Diagnosis not present

## 2019-09-17 DIAGNOSIS — C9200 Acute myeloblastic leukemia, not having achieved remission: Secondary | ICD-10-CM | POA: Diagnosis not present

## 2019-09-17 DIAGNOSIS — R339 Retention of urine, unspecified: Secondary | ICD-10-CM | POA: Diagnosis not present

## 2019-09-17 DIAGNOSIS — D649 Anemia, unspecified: Secondary | ICD-10-CM | POA: Diagnosis not present

## 2019-09-18 DIAGNOSIS — R140 Abdominal distension (gaseous): Secondary | ICD-10-CM | POA: Diagnosis not present

## 2019-09-18 DIAGNOSIS — D531 Other megaloblastic anemias, not elsewhere classified: Secondary | ICD-10-CM | POA: Diagnosis not present

## 2019-09-18 DIAGNOSIS — D649 Anemia, unspecified: Secondary | ICD-10-CM | POA: Diagnosis not present

## 2019-09-18 DIAGNOSIS — M061 Adult-onset Still's disease: Secondary | ICD-10-CM | POA: Diagnosis not present

## 2019-09-18 DIAGNOSIS — L0390 Cellulitis, unspecified: Secondary | ICD-10-CM | POA: Diagnosis not present

## 2019-09-18 DIAGNOSIS — D696 Thrombocytopenia, unspecified: Secondary | ICD-10-CM | POA: Diagnosis not present

## 2019-09-18 DIAGNOSIS — I6203 Nontraumatic chronic subdural hemorrhage: Secondary | ICD-10-CM | POA: Diagnosis not present

## 2019-09-18 DIAGNOSIS — B9689 Other specified bacterial agents as the cause of diseases classified elsewhere: Secondary | ICD-10-CM | POA: Diagnosis not present

## 2019-09-18 DIAGNOSIS — R339 Retention of urine, unspecified: Secondary | ICD-10-CM | POA: Diagnosis not present

## 2019-09-18 DIAGNOSIS — C9200 Acute myeloblastic leukemia, not having achieved remission: Secondary | ICD-10-CM | POA: Diagnosis not present

## 2019-09-18 DIAGNOSIS — B9562 Methicillin resistant Staphylococcus aureus infection as the cause of diseases classified elsewhere: Secondary | ICD-10-CM | POA: Diagnosis not present

## 2019-09-18 DIAGNOSIS — L040 Acute lymphadenitis of face, head and neck: Secondary | ICD-10-CM | POA: Diagnosis not present

## 2019-09-19 DIAGNOSIS — D531 Other megaloblastic anemias, not elsewhere classified: Secondary | ICD-10-CM | POA: Diagnosis not present

## 2019-09-19 DIAGNOSIS — D696 Thrombocytopenia, unspecified: Secondary | ICD-10-CM | POA: Diagnosis not present

## 2019-09-19 DIAGNOSIS — I6203 Nontraumatic chronic subdural hemorrhage: Secondary | ICD-10-CM | POA: Diagnosis not present

## 2019-09-19 DIAGNOSIS — D649 Anemia, unspecified: Secondary | ICD-10-CM | POA: Diagnosis not present

## 2019-09-19 DIAGNOSIS — M061 Adult-onset Still's disease: Secondary | ICD-10-CM | POA: Diagnosis not present

## 2019-09-19 DIAGNOSIS — B9562 Methicillin resistant Staphylococcus aureus infection as the cause of diseases classified elsewhere: Secondary | ICD-10-CM | POA: Diagnosis not present

## 2019-09-19 DIAGNOSIS — B9689 Other specified bacterial agents as the cause of diseases classified elsewhere: Secondary | ICD-10-CM | POA: Diagnosis not present

## 2019-09-19 DIAGNOSIS — L0390 Cellulitis, unspecified: Secondary | ICD-10-CM | POA: Diagnosis not present

## 2019-09-19 DIAGNOSIS — C9200 Acute myeloblastic leukemia, not having achieved remission: Secondary | ICD-10-CM | POA: Diagnosis not present

## 2019-09-19 DIAGNOSIS — L040 Acute lymphadenitis of face, head and neck: Secondary | ICD-10-CM | POA: Diagnosis not present

## 2019-09-19 DIAGNOSIS — R339 Retention of urine, unspecified: Secondary | ICD-10-CM | POA: Diagnosis not present

## 2019-09-20 DIAGNOSIS — D696 Thrombocytopenia, unspecified: Secondary | ICD-10-CM | POA: Diagnosis not present

## 2019-09-20 DIAGNOSIS — D649 Anemia, unspecified: Secondary | ICD-10-CM | POA: Diagnosis not present

## 2019-09-20 DIAGNOSIS — C9200 Acute myeloblastic leukemia, not having achieved remission: Secondary | ICD-10-CM | POA: Diagnosis not present

## 2019-09-20 DIAGNOSIS — I6203 Nontraumatic chronic subdural hemorrhage: Secondary | ICD-10-CM | POA: Diagnosis not present

## 2019-09-20 DIAGNOSIS — B9689 Other specified bacterial agents as the cause of diseases classified elsewhere: Secondary | ICD-10-CM | POA: Diagnosis not present

## 2019-09-20 DIAGNOSIS — D531 Other megaloblastic anemias, not elsewhere classified: Secondary | ICD-10-CM | POA: Diagnosis not present

## 2019-09-20 DIAGNOSIS — L0390 Cellulitis, unspecified: Secondary | ICD-10-CM | POA: Diagnosis not present

## 2019-09-20 DIAGNOSIS — B9562 Methicillin resistant Staphylococcus aureus infection as the cause of diseases classified elsewhere: Secondary | ICD-10-CM | POA: Diagnosis not present

## 2019-09-20 DIAGNOSIS — R339 Retention of urine, unspecified: Secondary | ICD-10-CM | POA: Diagnosis not present

## 2019-09-20 DIAGNOSIS — M061 Adult-onset Still's disease: Secondary | ICD-10-CM | POA: Diagnosis not present

## 2019-09-20 DIAGNOSIS — L040 Acute lymphadenitis of face, head and neck: Secondary | ICD-10-CM | POA: Diagnosis not present

## 2019-09-21 DIAGNOSIS — C9200 Acute myeloblastic leukemia, not having achieved remission: Secondary | ICD-10-CM | POA: Diagnosis not present

## 2019-09-21 DIAGNOSIS — D539 Nutritional anemia, unspecified: Secondary | ICD-10-CM | POA: Diagnosis not present

## 2019-09-21 DIAGNOSIS — D649 Anemia, unspecified: Secondary | ICD-10-CM | POA: Diagnosis not present

## 2019-09-21 DIAGNOSIS — I6203 Nontraumatic chronic subdural hemorrhage: Secondary | ICD-10-CM | POA: Diagnosis not present

## 2019-09-21 DIAGNOSIS — I6200 Nontraumatic subdural hemorrhage, unspecified: Secondary | ICD-10-CM | POA: Diagnosis not present

## 2019-09-21 DIAGNOSIS — D696 Thrombocytopenia, unspecified: Secondary | ICD-10-CM | POA: Diagnosis not present

## 2019-09-22 DIAGNOSIS — B9689 Other specified bacterial agents as the cause of diseases classified elsewhere: Secondary | ICD-10-CM | POA: Diagnosis not present

## 2019-09-22 DIAGNOSIS — M061 Adult-onset Still's disease: Secondary | ICD-10-CM | POA: Diagnosis not present

## 2019-09-22 DIAGNOSIS — B9562 Methicillin resistant Staphylococcus aureus infection as the cause of diseases classified elsewhere: Secondary | ICD-10-CM | POA: Diagnosis not present

## 2019-09-22 DIAGNOSIS — D649 Anemia, unspecified: Secondary | ICD-10-CM | POA: Diagnosis not present

## 2019-09-22 DIAGNOSIS — D531 Other megaloblastic anemias, not elsewhere classified: Secondary | ICD-10-CM | POA: Diagnosis not present

## 2019-09-22 DIAGNOSIS — C9200 Acute myeloblastic leukemia, not having achieved remission: Secondary | ICD-10-CM | POA: Diagnosis not present

## 2019-09-22 DIAGNOSIS — R339 Retention of urine, unspecified: Secondary | ICD-10-CM | POA: Diagnosis not present

## 2019-09-22 DIAGNOSIS — L040 Acute lymphadenitis of face, head and neck: Secondary | ICD-10-CM | POA: Diagnosis not present

## 2019-09-22 DIAGNOSIS — I6203 Nontraumatic chronic subdural hemorrhage: Secondary | ICD-10-CM | POA: Diagnosis not present

## 2019-09-22 DIAGNOSIS — D696 Thrombocytopenia, unspecified: Secondary | ICD-10-CM | POA: Diagnosis not present

## 2019-09-22 DIAGNOSIS — L0390 Cellulitis, unspecified: Secondary | ICD-10-CM | POA: Diagnosis not present

## 2019-09-23 DIAGNOSIS — R339 Retention of urine, unspecified: Secondary | ICD-10-CM | POA: Diagnosis not present

## 2019-09-23 DIAGNOSIS — B9562 Methicillin resistant Staphylococcus aureus infection as the cause of diseases classified elsewhere: Secondary | ICD-10-CM | POA: Diagnosis not present

## 2019-09-23 DIAGNOSIS — I6203 Nontraumatic chronic subdural hemorrhage: Secondary | ICD-10-CM | POA: Diagnosis not present

## 2019-09-23 DIAGNOSIS — C9500 Acute leukemia of unspecified cell type not having achieved remission: Secondary | ICD-10-CM | POA: Diagnosis not present

## 2019-09-23 DIAGNOSIS — D696 Thrombocytopenia, unspecified: Secondary | ICD-10-CM | POA: Diagnosis not present

## 2019-09-23 DIAGNOSIS — D649 Anemia, unspecified: Secondary | ICD-10-CM | POA: Diagnosis not present

## 2019-09-23 DIAGNOSIS — M061 Adult-onset Still's disease: Secondary | ICD-10-CM | POA: Diagnosis not present

## 2019-09-23 DIAGNOSIS — D531 Other megaloblastic anemias, not elsewhere classified: Secondary | ICD-10-CM | POA: Diagnosis not present

## 2019-09-23 DIAGNOSIS — B9689 Other specified bacterial agents as the cause of diseases classified elsewhere: Secondary | ICD-10-CM | POA: Diagnosis not present

## 2019-09-23 DIAGNOSIS — L040 Acute lymphadenitis of face, head and neck: Secondary | ICD-10-CM | POA: Diagnosis not present

## 2019-09-23 DIAGNOSIS — C9200 Acute myeloblastic leukemia, not having achieved remission: Secondary | ICD-10-CM | POA: Diagnosis not present

## 2019-09-23 DIAGNOSIS — L0390 Cellulitis, unspecified: Secondary | ICD-10-CM | POA: Diagnosis not present

## 2019-09-24 DIAGNOSIS — B9562 Methicillin resistant Staphylococcus aureus infection as the cause of diseases classified elsewhere: Secondary | ICD-10-CM | POA: Diagnosis not present

## 2019-09-24 DIAGNOSIS — K529 Noninfective gastroenteritis and colitis, unspecified: Secondary | ICD-10-CM | POA: Diagnosis not present

## 2019-09-24 DIAGNOSIS — L0390 Cellulitis, unspecified: Secondary | ICD-10-CM | POA: Diagnosis not present

## 2019-09-24 DIAGNOSIS — I6203 Nontraumatic chronic subdural hemorrhage: Secondary | ICD-10-CM | POA: Diagnosis not present

## 2019-09-24 DIAGNOSIS — D649 Anemia, unspecified: Secondary | ICD-10-CM | POA: Diagnosis not present

## 2019-09-24 DIAGNOSIS — L040 Acute lymphadenitis of face, head and neck: Secondary | ICD-10-CM | POA: Diagnosis not present

## 2019-09-24 DIAGNOSIS — R339 Retention of urine, unspecified: Secondary | ICD-10-CM | POA: Diagnosis not present

## 2019-09-24 DIAGNOSIS — M061 Adult-onset Still's disease: Secondary | ICD-10-CM | POA: Diagnosis not present

## 2019-09-24 DIAGNOSIS — B9689 Other specified bacterial agents as the cause of diseases classified elsewhere: Secondary | ICD-10-CM | POA: Diagnosis not present

## 2019-09-24 DIAGNOSIS — D531 Other megaloblastic anemias, not elsewhere classified: Secondary | ICD-10-CM | POA: Diagnosis not present

## 2019-09-24 DIAGNOSIS — C9200 Acute myeloblastic leukemia, not having achieved remission: Secondary | ICD-10-CM | POA: Diagnosis not present

## 2019-09-24 DIAGNOSIS — D696 Thrombocytopenia, unspecified: Secondary | ICD-10-CM | POA: Diagnosis not present

## 2019-09-25 DIAGNOSIS — B9562 Methicillin resistant Staphylococcus aureus infection as the cause of diseases classified elsewhere: Secondary | ICD-10-CM | POA: Diagnosis not present

## 2019-09-25 DIAGNOSIS — D649 Anemia, unspecified: Secondary | ICD-10-CM | POA: Diagnosis not present

## 2019-09-25 DIAGNOSIS — L040 Acute lymphadenitis of face, head and neck: Secondary | ICD-10-CM | POA: Diagnosis not present

## 2019-09-25 DIAGNOSIS — C9200 Acute myeloblastic leukemia, not having achieved remission: Secondary | ICD-10-CM | POA: Diagnosis not present

## 2019-09-25 DIAGNOSIS — B9689 Other specified bacterial agents as the cause of diseases classified elsewhere: Secondary | ICD-10-CM | POA: Diagnosis not present

## 2019-09-25 DIAGNOSIS — D696 Thrombocytopenia, unspecified: Secondary | ICD-10-CM | POA: Diagnosis not present

## 2019-09-25 DIAGNOSIS — D531 Other megaloblastic anemias, not elsewhere classified: Secondary | ICD-10-CM | POA: Diagnosis not present

## 2019-09-25 DIAGNOSIS — R339 Retention of urine, unspecified: Secondary | ICD-10-CM | POA: Diagnosis not present

## 2019-09-25 DIAGNOSIS — I6203 Nontraumatic chronic subdural hemorrhage: Secondary | ICD-10-CM | POA: Diagnosis not present

## 2019-09-25 DIAGNOSIS — L0390 Cellulitis, unspecified: Secondary | ICD-10-CM | POA: Diagnosis not present

## 2019-09-25 DIAGNOSIS — M061 Adult-onset Still's disease: Secondary | ICD-10-CM | POA: Diagnosis not present

## 2019-09-26 DIAGNOSIS — D696 Thrombocytopenia, unspecified: Secondary | ICD-10-CM | POA: Diagnosis not present

## 2019-09-26 DIAGNOSIS — R339 Retention of urine, unspecified: Secondary | ICD-10-CM | POA: Diagnosis not present

## 2019-09-26 DIAGNOSIS — B9562 Methicillin resistant Staphylococcus aureus infection as the cause of diseases classified elsewhere: Secondary | ICD-10-CM | POA: Diagnosis not present

## 2019-09-26 DIAGNOSIS — I6203 Nontraumatic chronic subdural hemorrhage: Secondary | ICD-10-CM | POA: Diagnosis not present

## 2019-09-26 DIAGNOSIS — B9689 Other specified bacterial agents as the cause of diseases classified elsewhere: Secondary | ICD-10-CM | POA: Diagnosis not present

## 2019-09-26 DIAGNOSIS — D531 Other megaloblastic anemias, not elsewhere classified: Secondary | ICD-10-CM | POA: Diagnosis not present

## 2019-09-26 DIAGNOSIS — M061 Adult-onset Still's disease: Secondary | ICD-10-CM | POA: Diagnosis not present

## 2019-09-26 DIAGNOSIS — D649 Anemia, unspecified: Secondary | ICD-10-CM | POA: Diagnosis not present

## 2019-09-26 DIAGNOSIS — L040 Acute lymphadenitis of face, head and neck: Secondary | ICD-10-CM | POA: Diagnosis not present

## 2019-09-26 DIAGNOSIS — C9200 Acute myeloblastic leukemia, not having achieved remission: Secondary | ICD-10-CM | POA: Diagnosis not present

## 2019-09-26 DIAGNOSIS — L0390 Cellulitis, unspecified: Secondary | ICD-10-CM | POA: Diagnosis not present

## 2019-09-27 DIAGNOSIS — D649 Anemia, unspecified: Secondary | ICD-10-CM | POA: Diagnosis not present

## 2019-09-27 DIAGNOSIS — B9689 Other specified bacterial agents as the cause of diseases classified elsewhere: Secondary | ICD-10-CM | POA: Diagnosis not present

## 2019-09-27 DIAGNOSIS — C9200 Acute myeloblastic leukemia, not having achieved remission: Secondary | ICD-10-CM | POA: Diagnosis not present

## 2019-09-27 DIAGNOSIS — L0390 Cellulitis, unspecified: Secondary | ICD-10-CM | POA: Diagnosis not present

## 2019-09-27 DIAGNOSIS — R339 Retention of urine, unspecified: Secondary | ICD-10-CM | POA: Diagnosis not present

## 2019-09-27 DIAGNOSIS — B9562 Methicillin resistant Staphylococcus aureus infection as the cause of diseases classified elsewhere: Secondary | ICD-10-CM | POA: Diagnosis not present

## 2019-09-27 DIAGNOSIS — D696 Thrombocytopenia, unspecified: Secondary | ICD-10-CM | POA: Diagnosis not present

## 2019-09-27 DIAGNOSIS — D531 Other megaloblastic anemias, not elsewhere classified: Secondary | ICD-10-CM | POA: Diagnosis not present

## 2019-09-27 DIAGNOSIS — L040 Acute lymphadenitis of face, head and neck: Secondary | ICD-10-CM | POA: Diagnosis not present

## 2019-09-27 DIAGNOSIS — M061 Adult-onset Still's disease: Secondary | ICD-10-CM | POA: Diagnosis not present

## 2019-09-27 DIAGNOSIS — I6203 Nontraumatic chronic subdural hemorrhage: Secondary | ICD-10-CM | POA: Diagnosis not present

## 2019-09-28 DIAGNOSIS — D696 Thrombocytopenia, unspecified: Secondary | ICD-10-CM | POA: Diagnosis not present

## 2019-09-28 DIAGNOSIS — B9689 Other specified bacterial agents as the cause of diseases classified elsewhere: Secondary | ICD-10-CM | POA: Diagnosis not present

## 2019-09-28 DIAGNOSIS — I6203 Nontraumatic chronic subdural hemorrhage: Secondary | ICD-10-CM | POA: Diagnosis not present

## 2019-09-28 DIAGNOSIS — D649 Anemia, unspecified: Secondary | ICD-10-CM | POA: Diagnosis not present

## 2019-09-28 DIAGNOSIS — B9562 Methicillin resistant Staphylococcus aureus infection as the cause of diseases classified elsewhere: Secondary | ICD-10-CM | POA: Diagnosis not present

## 2019-09-28 DIAGNOSIS — C9200 Acute myeloblastic leukemia, not having achieved remission: Secondary | ICD-10-CM | POA: Diagnosis not present

## 2019-09-28 DIAGNOSIS — R339 Retention of urine, unspecified: Secondary | ICD-10-CM | POA: Diagnosis not present

## 2019-09-28 DIAGNOSIS — L040 Acute lymphadenitis of face, head and neck: Secondary | ICD-10-CM | POA: Diagnosis not present

## 2019-09-28 DIAGNOSIS — M061 Adult-onset Still's disease: Secondary | ICD-10-CM | POA: Diagnosis not present

## 2019-09-28 DIAGNOSIS — L0390 Cellulitis, unspecified: Secondary | ICD-10-CM | POA: Diagnosis not present

## 2019-09-28 DIAGNOSIS — D531 Other megaloblastic anemias, not elsewhere classified: Secondary | ICD-10-CM | POA: Diagnosis not present

## 2019-09-29 DIAGNOSIS — D696 Thrombocytopenia, unspecified: Secondary | ICD-10-CM | POA: Diagnosis not present

## 2019-09-29 DIAGNOSIS — D531 Other megaloblastic anemias, not elsewhere classified: Secondary | ICD-10-CM | POA: Diagnosis not present

## 2019-09-29 DIAGNOSIS — B9562 Methicillin resistant Staphylococcus aureus infection as the cause of diseases classified elsewhere: Secondary | ICD-10-CM | POA: Diagnosis not present

## 2019-09-29 DIAGNOSIS — R339 Retention of urine, unspecified: Secondary | ICD-10-CM | POA: Diagnosis not present

## 2019-09-29 DIAGNOSIS — I6203 Nontraumatic chronic subdural hemorrhage: Secondary | ICD-10-CM | POA: Diagnosis not present

## 2019-09-29 DIAGNOSIS — B9689 Other specified bacterial agents as the cause of diseases classified elsewhere: Secondary | ICD-10-CM | POA: Diagnosis not present

## 2019-09-29 DIAGNOSIS — D649 Anemia, unspecified: Secondary | ICD-10-CM | POA: Diagnosis not present

## 2019-09-29 DIAGNOSIS — M061 Adult-onset Still's disease: Secondary | ICD-10-CM | POA: Diagnosis not present

## 2019-09-29 DIAGNOSIS — L0390 Cellulitis, unspecified: Secondary | ICD-10-CM | POA: Diagnosis not present

## 2019-09-29 DIAGNOSIS — L040 Acute lymphadenitis of face, head and neck: Secondary | ICD-10-CM | POA: Diagnosis not present

## 2019-09-29 DIAGNOSIS — C9200 Acute myeloblastic leukemia, not having achieved remission: Secondary | ICD-10-CM | POA: Diagnosis not present

## 2019-09-30 ENCOUNTER — Ambulatory Visit: Payer: Medicare Other | Admitting: Cardiovascular Disease

## 2019-09-30 DIAGNOSIS — K5289 Other specified noninfective gastroenteritis and colitis: Secondary | ICD-10-CM | POA: Diagnosis not present

## 2019-09-30 DIAGNOSIS — I6203 Nontraumatic chronic subdural hemorrhage: Secondary | ICD-10-CM | POA: Diagnosis not present

## 2019-09-30 DIAGNOSIS — C9200 Acute myeloblastic leukemia, not having achieved remission: Secondary | ICD-10-CM | POA: Diagnosis not present

## 2019-09-30 DIAGNOSIS — D709 Neutropenia, unspecified: Secondary | ICD-10-CM | POA: Diagnosis not present

## 2019-09-30 DIAGNOSIS — D531 Other megaloblastic anemias, not elsewhere classified: Secondary | ICD-10-CM | POA: Diagnosis not present

## 2019-09-30 DIAGNOSIS — R5081 Fever presenting with conditions classified elsewhere: Secondary | ICD-10-CM | POA: Diagnosis not present

## 2019-09-30 DIAGNOSIS — D696 Thrombocytopenia, unspecified: Secondary | ICD-10-CM | POA: Diagnosis not present

## 2019-09-30 DIAGNOSIS — M061 Adult-onset Still's disease: Secondary | ICD-10-CM | POA: Diagnosis not present

## 2019-09-30 DIAGNOSIS — R918 Other nonspecific abnormal finding of lung field: Secondary | ICD-10-CM | POA: Diagnosis not present

## 2019-09-30 DIAGNOSIS — D649 Anemia, unspecified: Secondary | ICD-10-CM | POA: Diagnosis not present

## 2019-10-01 DIAGNOSIS — C9200 Acute myeloblastic leukemia, not having achieved remission: Secondary | ICD-10-CM | POA: Diagnosis not present

## 2019-10-01 DIAGNOSIS — M061 Adult-onset Still's disease: Secondary | ICD-10-CM | POA: Diagnosis not present

## 2019-10-01 DIAGNOSIS — D649 Anemia, unspecified: Secondary | ICD-10-CM | POA: Diagnosis not present

## 2019-10-01 DIAGNOSIS — R5081 Fever presenting with conditions classified elsewhere: Secondary | ICD-10-CM | POA: Diagnosis not present

## 2019-10-01 DIAGNOSIS — D696 Thrombocytopenia, unspecified: Secondary | ICD-10-CM | POA: Diagnosis not present

## 2019-10-01 DIAGNOSIS — K5289 Other specified noninfective gastroenteritis and colitis: Secondary | ICD-10-CM | POA: Diagnosis not present

## 2019-10-01 DIAGNOSIS — D709 Neutropenia, unspecified: Secondary | ICD-10-CM | POA: Diagnosis not present

## 2019-10-01 DIAGNOSIS — I6203 Nontraumatic chronic subdural hemorrhage: Secondary | ICD-10-CM | POA: Diagnosis not present

## 2019-10-01 DIAGNOSIS — D531 Other megaloblastic anemias, not elsewhere classified: Secondary | ICD-10-CM | POA: Diagnosis not present

## 2019-10-01 DIAGNOSIS — R918 Other nonspecific abnormal finding of lung field: Secondary | ICD-10-CM | POA: Diagnosis not present

## 2019-10-02 DIAGNOSIS — D649 Anemia, unspecified: Secondary | ICD-10-CM | POA: Diagnosis not present

## 2019-10-02 DIAGNOSIS — R918 Other nonspecific abnormal finding of lung field: Secondary | ICD-10-CM | POA: Diagnosis not present

## 2019-10-02 DIAGNOSIS — D696 Thrombocytopenia, unspecified: Secondary | ICD-10-CM | POA: Diagnosis not present

## 2019-10-02 DIAGNOSIS — M061 Adult-onset Still's disease: Secondary | ICD-10-CM | POA: Diagnosis not present

## 2019-10-02 DIAGNOSIS — D709 Neutropenia, unspecified: Secondary | ICD-10-CM | POA: Diagnosis not present

## 2019-10-02 DIAGNOSIS — D531 Other megaloblastic anemias, not elsewhere classified: Secondary | ICD-10-CM | POA: Diagnosis not present

## 2019-10-02 DIAGNOSIS — C9200 Acute myeloblastic leukemia, not having achieved remission: Secondary | ICD-10-CM | POA: Diagnosis not present

## 2019-10-02 DIAGNOSIS — R5081 Fever presenting with conditions classified elsewhere: Secondary | ICD-10-CM | POA: Diagnosis not present

## 2019-10-02 DIAGNOSIS — K5289 Other specified noninfective gastroenteritis and colitis: Secondary | ICD-10-CM | POA: Diagnosis not present

## 2019-10-02 DIAGNOSIS — I6203 Nontraumatic chronic subdural hemorrhage: Secondary | ICD-10-CM | POA: Diagnosis not present

## 2019-10-03 DIAGNOSIS — C9200 Acute myeloblastic leukemia, not having achieved remission: Secondary | ICD-10-CM | POA: Diagnosis not present

## 2019-10-03 DIAGNOSIS — I6203 Nontraumatic chronic subdural hemorrhage: Secondary | ICD-10-CM | POA: Diagnosis not present

## 2019-10-03 DIAGNOSIS — D531 Other megaloblastic anemias, not elsewhere classified: Secondary | ICD-10-CM | POA: Diagnosis not present

## 2019-10-03 DIAGNOSIS — R918 Other nonspecific abnormal finding of lung field: Secondary | ICD-10-CM | POA: Diagnosis not present

## 2019-10-03 DIAGNOSIS — K5289 Other specified noninfective gastroenteritis and colitis: Secondary | ICD-10-CM | POA: Diagnosis not present

## 2019-10-03 DIAGNOSIS — D709 Neutropenia, unspecified: Secondary | ICD-10-CM | POA: Diagnosis not present

## 2019-10-03 DIAGNOSIS — D696 Thrombocytopenia, unspecified: Secondary | ICD-10-CM | POA: Diagnosis not present

## 2019-10-03 DIAGNOSIS — D649 Anemia, unspecified: Secondary | ICD-10-CM | POA: Diagnosis not present

## 2019-10-03 DIAGNOSIS — M061 Adult-onset Still's disease: Secondary | ICD-10-CM | POA: Diagnosis not present

## 2019-10-03 DIAGNOSIS — R5081 Fever presenting with conditions classified elsewhere: Secondary | ICD-10-CM | POA: Diagnosis not present

## 2019-10-04 DIAGNOSIS — K5289 Other specified noninfective gastroenteritis and colitis: Secondary | ICD-10-CM | POA: Diagnosis not present

## 2019-10-04 DIAGNOSIS — M061 Adult-onset Still's disease: Secondary | ICD-10-CM | POA: Diagnosis not present

## 2019-10-04 DIAGNOSIS — D531 Other megaloblastic anemias, not elsewhere classified: Secondary | ICD-10-CM | POA: Diagnosis not present

## 2019-10-04 DIAGNOSIS — R918 Other nonspecific abnormal finding of lung field: Secondary | ICD-10-CM | POA: Diagnosis not present

## 2019-10-04 DIAGNOSIS — R5081 Fever presenting with conditions classified elsewhere: Secondary | ICD-10-CM | POA: Diagnosis not present

## 2019-10-04 DIAGNOSIS — D649 Anemia, unspecified: Secondary | ICD-10-CM | POA: Diagnosis not present

## 2019-10-04 DIAGNOSIS — C9200 Acute myeloblastic leukemia, not having achieved remission: Secondary | ICD-10-CM | POA: Diagnosis not present

## 2019-10-04 DIAGNOSIS — D696 Thrombocytopenia, unspecified: Secondary | ICD-10-CM | POA: Diagnosis not present

## 2019-10-04 DIAGNOSIS — D709 Neutropenia, unspecified: Secondary | ICD-10-CM | POA: Diagnosis not present

## 2019-10-04 DIAGNOSIS — I6203 Nontraumatic chronic subdural hemorrhage: Secondary | ICD-10-CM | POA: Diagnosis not present

## 2019-10-05 ENCOUNTER — Telehealth: Payer: Self-pay | Admitting: Cardiovascular Disease

## 2019-10-05 DIAGNOSIS — M061 Adult-onset Still's disease: Secondary | ICD-10-CM | POA: Diagnosis not present

## 2019-10-05 DIAGNOSIS — D649 Anemia, unspecified: Secondary | ICD-10-CM | POA: Diagnosis not present

## 2019-10-05 DIAGNOSIS — C9200 Acute myeloblastic leukemia, not having achieved remission: Secondary | ICD-10-CM | POA: Diagnosis not present

## 2019-10-05 DIAGNOSIS — I6203 Nontraumatic chronic subdural hemorrhage: Secondary | ICD-10-CM | POA: Diagnosis not present

## 2019-10-05 DIAGNOSIS — R918 Other nonspecific abnormal finding of lung field: Secondary | ICD-10-CM | POA: Diagnosis not present

## 2019-10-05 DIAGNOSIS — R5081 Fever presenting with conditions classified elsewhere: Secondary | ICD-10-CM | POA: Diagnosis not present

## 2019-10-05 DIAGNOSIS — D531 Other megaloblastic anemias, not elsewhere classified: Secondary | ICD-10-CM | POA: Diagnosis not present

## 2019-10-05 DIAGNOSIS — D709 Neutropenia, unspecified: Secondary | ICD-10-CM | POA: Diagnosis not present

## 2019-10-05 DIAGNOSIS — K5289 Other specified noninfective gastroenteritis and colitis: Secondary | ICD-10-CM | POA: Diagnosis not present

## 2019-10-05 DIAGNOSIS — D696 Thrombocytopenia, unspecified: Secondary | ICD-10-CM | POA: Diagnosis not present

## 2019-10-05 NOTE — Telephone Encounter (Signed)
Called pt regarding mychart message. Left message to call back.

## 2019-10-05 NOTE — Telephone Encounter (Signed)
Pt says he already spoke with another nurse, Lars Mage, earlier and he says he is all taken care of and thanked me for the call.

## 2019-10-05 NOTE — Telephone Encounter (Signed)
Patient returning call in regard to [facility] message.

## 2019-10-05 NOTE — Telephone Encounter (Signed)
Spoke to pt who state he was able to determine symptoms were no cardiac related and was recently dx with leukemia. Pt state he has been hospitalized for the past month receiving treatment but feeling much better.

## 2019-10-06 DIAGNOSIS — D531 Other megaloblastic anemias, not elsewhere classified: Secondary | ICD-10-CM | POA: Diagnosis not present

## 2019-10-06 DIAGNOSIS — D696 Thrombocytopenia, unspecified: Secondary | ICD-10-CM | POA: Diagnosis not present

## 2019-10-06 DIAGNOSIS — R918 Other nonspecific abnormal finding of lung field: Secondary | ICD-10-CM | POA: Diagnosis not present

## 2019-10-06 DIAGNOSIS — D649 Anemia, unspecified: Secondary | ICD-10-CM | POA: Diagnosis not present

## 2019-10-06 DIAGNOSIS — D709 Neutropenia, unspecified: Secondary | ICD-10-CM | POA: Diagnosis not present

## 2019-10-06 DIAGNOSIS — I6203 Nontraumatic chronic subdural hemorrhage: Secondary | ICD-10-CM | POA: Diagnosis not present

## 2019-10-06 DIAGNOSIS — C9200 Acute myeloblastic leukemia, not having achieved remission: Secondary | ICD-10-CM | POA: Diagnosis not present

## 2019-10-06 DIAGNOSIS — R5081 Fever presenting with conditions classified elsewhere: Secondary | ICD-10-CM | POA: Diagnosis not present

## 2019-10-06 DIAGNOSIS — M061 Adult-onset Still's disease: Secondary | ICD-10-CM | POA: Diagnosis not present

## 2019-10-06 DIAGNOSIS — K5289 Other specified noninfective gastroenteritis and colitis: Secondary | ICD-10-CM | POA: Diagnosis not present

## 2019-10-07 DIAGNOSIS — R918 Other nonspecific abnormal finding of lung field: Secondary | ICD-10-CM | POA: Diagnosis not present

## 2019-10-07 DIAGNOSIS — D696 Thrombocytopenia, unspecified: Secondary | ICD-10-CM | POA: Diagnosis not present

## 2019-10-07 DIAGNOSIS — R5081 Fever presenting with conditions classified elsewhere: Secondary | ICD-10-CM | POA: Diagnosis not present

## 2019-10-07 DIAGNOSIS — D649 Anemia, unspecified: Secondary | ICD-10-CM | POA: Diagnosis not present

## 2019-10-07 DIAGNOSIS — I6203 Nontraumatic chronic subdural hemorrhage: Secondary | ICD-10-CM | POA: Diagnosis not present

## 2019-10-07 DIAGNOSIS — M061 Adult-onset Still's disease: Secondary | ICD-10-CM | POA: Diagnosis not present

## 2019-10-07 DIAGNOSIS — K5289 Other specified noninfective gastroenteritis and colitis: Secondary | ICD-10-CM | POA: Diagnosis not present

## 2019-10-07 DIAGNOSIS — C9200 Acute myeloblastic leukemia, not having achieved remission: Secondary | ICD-10-CM | POA: Diagnosis not present

## 2019-10-07 DIAGNOSIS — D531 Other megaloblastic anemias, not elsewhere classified: Secondary | ICD-10-CM | POA: Diagnosis not present

## 2019-10-07 DIAGNOSIS — D709 Neutropenia, unspecified: Secondary | ICD-10-CM | POA: Diagnosis not present

## 2019-10-08 DIAGNOSIS — D709 Neutropenia, unspecified: Secondary | ICD-10-CM | POA: Diagnosis not present

## 2019-10-08 DIAGNOSIS — C9200 Acute myeloblastic leukemia, not having achieved remission: Secondary | ICD-10-CM | POA: Diagnosis not present

## 2019-10-08 DIAGNOSIS — R918 Other nonspecific abnormal finding of lung field: Secondary | ICD-10-CM | POA: Diagnosis not present

## 2019-10-08 DIAGNOSIS — R5081 Fever presenting with conditions classified elsewhere: Secondary | ICD-10-CM | POA: Diagnosis not present

## 2019-10-08 DIAGNOSIS — Z5111 Encounter for antineoplastic chemotherapy: Secondary | ICD-10-CM | POA: Diagnosis not present

## 2019-10-09 DIAGNOSIS — R918 Other nonspecific abnormal finding of lung field: Secondary | ICD-10-CM | POA: Diagnosis not present

## 2019-10-09 DIAGNOSIS — C9200 Acute myeloblastic leukemia, not having achieved remission: Secondary | ICD-10-CM | POA: Diagnosis not present

## 2019-10-09 DIAGNOSIS — D709 Neutropenia, unspecified: Secondary | ICD-10-CM | POA: Diagnosis not present

## 2019-10-09 DIAGNOSIS — Z5111 Encounter for antineoplastic chemotherapy: Secondary | ICD-10-CM | POA: Diagnosis not present

## 2019-10-09 DIAGNOSIS — R5081 Fever presenting with conditions classified elsewhere: Secondary | ICD-10-CM | POA: Diagnosis not present

## 2019-10-10 DIAGNOSIS — C9200 Acute myeloblastic leukemia, not having achieved remission: Secondary | ICD-10-CM | POA: Diagnosis not present

## 2019-10-10 DIAGNOSIS — R918 Other nonspecific abnormal finding of lung field: Secondary | ICD-10-CM | POA: Diagnosis not present

## 2019-10-10 DIAGNOSIS — Z5111 Encounter for antineoplastic chemotherapy: Secondary | ICD-10-CM | POA: Diagnosis not present

## 2019-10-10 DIAGNOSIS — D709 Neutropenia, unspecified: Secondary | ICD-10-CM | POA: Diagnosis not present

## 2019-10-10 DIAGNOSIS — R5081 Fever presenting with conditions classified elsewhere: Secondary | ICD-10-CM | POA: Diagnosis not present

## 2019-10-11 DIAGNOSIS — R5081 Fever presenting with conditions classified elsewhere: Secondary | ICD-10-CM | POA: Diagnosis not present

## 2019-10-11 DIAGNOSIS — D709 Neutropenia, unspecified: Secondary | ICD-10-CM | POA: Diagnosis not present

## 2019-10-11 DIAGNOSIS — C9200 Acute myeloblastic leukemia, not having achieved remission: Secondary | ICD-10-CM | POA: Diagnosis not present

## 2019-10-11 DIAGNOSIS — Z5111 Encounter for antineoplastic chemotherapy: Secondary | ICD-10-CM | POA: Diagnosis not present

## 2019-10-11 DIAGNOSIS — R918 Other nonspecific abnormal finding of lung field: Secondary | ICD-10-CM | POA: Diagnosis not present

## 2019-10-12 DIAGNOSIS — Z5111 Encounter for antineoplastic chemotherapy: Secondary | ICD-10-CM | POA: Diagnosis not present

## 2019-10-12 DIAGNOSIS — R918 Other nonspecific abnormal finding of lung field: Secondary | ICD-10-CM | POA: Diagnosis not present

## 2019-10-12 DIAGNOSIS — C9200 Acute myeloblastic leukemia, not having achieved remission: Secondary | ICD-10-CM | POA: Diagnosis not present

## 2019-10-12 DIAGNOSIS — R5081 Fever presenting with conditions classified elsewhere: Secondary | ICD-10-CM | POA: Diagnosis not present

## 2019-10-12 DIAGNOSIS — D709 Neutropenia, unspecified: Secondary | ICD-10-CM | POA: Diagnosis not present

## 2019-10-13 DIAGNOSIS — Z5111 Encounter for antineoplastic chemotherapy: Secondary | ICD-10-CM | POA: Diagnosis not present

## 2019-10-13 DIAGNOSIS — R5081 Fever presenting with conditions classified elsewhere: Secondary | ICD-10-CM | POA: Diagnosis not present

## 2019-10-13 DIAGNOSIS — C9200 Acute myeloblastic leukemia, not having achieved remission: Secondary | ICD-10-CM | POA: Diagnosis not present

## 2019-10-13 DIAGNOSIS — D709 Neutropenia, unspecified: Secondary | ICD-10-CM | POA: Diagnosis not present

## 2019-10-13 DIAGNOSIS — R918 Other nonspecific abnormal finding of lung field: Secondary | ICD-10-CM | POA: Diagnosis not present

## 2019-10-15 DIAGNOSIS — C9200 Acute myeloblastic leukemia, not having achieved remission: Secondary | ICD-10-CM | POA: Diagnosis not present

## 2019-10-18 DIAGNOSIS — C9200 Acute myeloblastic leukemia, not having achieved remission: Secondary | ICD-10-CM | POA: Diagnosis not present

## 2019-10-23 DIAGNOSIS — R911 Solitary pulmonary nodule: Secondary | ICD-10-CM | POA: Diagnosis not present

## 2019-10-23 DIAGNOSIS — C9200 Acute myeloblastic leukemia, not having achieved remission: Secondary | ICD-10-CM | POA: Diagnosis not present

## 2019-10-23 DIAGNOSIS — D709 Neutropenia, unspecified: Secondary | ICD-10-CM | POA: Diagnosis not present

## 2019-10-23 DIAGNOSIS — Z23 Encounter for immunization: Secondary | ICD-10-CM | POA: Diagnosis not present

## 2019-10-30 DIAGNOSIS — K5289 Other specified noninfective gastroenteritis and colitis: Secondary | ICD-10-CM | POA: Diagnosis not present

## 2019-10-30 DIAGNOSIS — R911 Solitary pulmonary nodule: Secondary | ICD-10-CM | POA: Diagnosis not present

## 2019-10-30 DIAGNOSIS — R918 Other nonspecific abnormal finding of lung field: Secondary | ICD-10-CM | POA: Diagnosis not present

## 2019-10-30 DIAGNOSIS — C9200 Acute myeloblastic leukemia, not having achieved remission: Secondary | ICD-10-CM | POA: Diagnosis not present

## 2019-10-30 DIAGNOSIS — Z79899 Other long term (current) drug therapy: Secondary | ICD-10-CM | POA: Diagnosis not present

## 2019-10-30 DIAGNOSIS — Z95828 Presence of other vascular implants and grafts: Secondary | ICD-10-CM | POA: Diagnosis not present

## 2019-10-30 DIAGNOSIS — S0003XD Contusion of scalp, subsequent encounter: Secondary | ICD-10-CM | POA: Diagnosis not present

## 2019-10-30 DIAGNOSIS — Z9221 Personal history of antineoplastic chemotherapy: Secondary | ICD-10-CM | POA: Diagnosis not present

## 2019-10-30 DIAGNOSIS — Z792 Long term (current) use of antibiotics: Secondary | ICD-10-CM | POA: Diagnosis not present

## 2019-10-30 DIAGNOSIS — B9689 Other specified bacterial agents as the cause of diseases classified elsewhere: Secondary | ICD-10-CM | POA: Diagnosis not present

## 2019-10-30 DIAGNOSIS — M061 Adult-onset Still's disease: Secondary | ICD-10-CM | POA: Diagnosis not present

## 2019-10-30 DIAGNOSIS — N179 Acute kidney failure, unspecified: Secondary | ICD-10-CM | POA: Diagnosis not present

## 2019-10-30 DIAGNOSIS — B958 Unspecified staphylococcus as the cause of diseases classified elsewhere: Secondary | ICD-10-CM | POA: Diagnosis not present

## 2019-11-06 DIAGNOSIS — Z95828 Presence of other vascular implants and grafts: Secondary | ICD-10-CM | POA: Diagnosis not present

## 2019-11-06 DIAGNOSIS — Z23 Encounter for immunization: Secondary | ICD-10-CM | POA: Diagnosis not present

## 2019-11-06 DIAGNOSIS — C9200 Acute myeloblastic leukemia, not having achieved remission: Secondary | ICD-10-CM | POA: Diagnosis not present

## 2019-11-06 DIAGNOSIS — N179 Acute kidney failure, unspecified: Secondary | ICD-10-CM | POA: Diagnosis not present

## 2019-11-06 DIAGNOSIS — L03211 Cellulitis of face: Secondary | ICD-10-CM | POA: Diagnosis not present

## 2019-11-06 DIAGNOSIS — B9561 Methicillin susceptible Staphylococcus aureus infection as the cause of diseases classified elsewhere: Secondary | ICD-10-CM | POA: Diagnosis not present

## 2019-11-06 DIAGNOSIS — S065X9S Traumatic subdural hemorrhage with loss of consciousness of unspecified duration, sequela: Secondary | ICD-10-CM | POA: Diagnosis not present

## 2019-11-06 DIAGNOSIS — K5289 Other specified noninfective gastroenteritis and colitis: Secondary | ICD-10-CM | POA: Diagnosis not present

## 2019-11-06 DIAGNOSIS — D709 Neutropenia, unspecified: Secondary | ICD-10-CM | POA: Diagnosis not present

## 2019-11-06 DIAGNOSIS — R918 Other nonspecific abnormal finding of lung field: Secondary | ICD-10-CM | POA: Diagnosis not present

## 2019-11-06 DIAGNOSIS — Z79899 Other long term (current) drug therapy: Secondary | ICD-10-CM | POA: Diagnosis not present

## 2019-11-06 DIAGNOSIS — M061 Adult-onset Still's disease: Secondary | ICD-10-CM | POA: Diagnosis not present

## 2019-11-13 DIAGNOSIS — R911 Solitary pulmonary nodule: Secondary | ICD-10-CM | POA: Diagnosis not present

## 2019-11-13 DIAGNOSIS — Z95828 Presence of other vascular implants and grafts: Secondary | ICD-10-CM | POA: Diagnosis not present

## 2019-11-13 DIAGNOSIS — R918 Other nonspecific abnormal finding of lung field: Secondary | ICD-10-CM | POA: Diagnosis not present

## 2019-11-13 DIAGNOSIS — D7589 Other specified diseases of blood and blood-forming organs: Secondary | ICD-10-CM | POA: Diagnosis not present

## 2019-11-13 DIAGNOSIS — C9200 Acute myeloblastic leukemia, not having achieved remission: Secondary | ICD-10-CM | POA: Diagnosis not present

## 2019-11-13 DIAGNOSIS — Z79899 Other long term (current) drug therapy: Secondary | ICD-10-CM | POA: Diagnosis not present

## 2019-11-13 DIAGNOSIS — M061 Adult-onset Still's disease: Secondary | ICD-10-CM | POA: Diagnosis not present

## 2019-11-13 DIAGNOSIS — Z006 Encounter for examination for normal comparison and control in clinical research program: Secondary | ICD-10-CM | POA: Diagnosis not present

## 2019-11-13 DIAGNOSIS — S065X9S Traumatic subdural hemorrhage with loss of consciousness of unspecified duration, sequela: Secondary | ICD-10-CM | POA: Diagnosis not present

## 2019-11-14 DIAGNOSIS — C9200 Acute myeloblastic leukemia, not having achieved remission: Secondary | ICD-10-CM | POA: Diagnosis not present

## 2019-11-17 DIAGNOSIS — C9200 Acute myeloblastic leukemia, not having achieved remission: Secondary | ICD-10-CM | POA: Diagnosis not present

## 2019-11-20 DIAGNOSIS — C9200 Acute myeloblastic leukemia, not having achieved remission: Secondary | ICD-10-CM | POA: Diagnosis not present

## 2019-11-24 DIAGNOSIS — N179 Acute kidney failure, unspecified: Secondary | ICD-10-CM | POA: Diagnosis not present

## 2019-11-24 DIAGNOSIS — D709 Neutropenia, unspecified: Secondary | ICD-10-CM | POA: Diagnosis not present

## 2019-11-24 DIAGNOSIS — R918 Other nonspecific abnormal finding of lung field: Secondary | ICD-10-CM | POA: Diagnosis not present

## 2019-11-24 DIAGNOSIS — C9200 Acute myeloblastic leukemia, not having achieved remission: Secondary | ICD-10-CM | POA: Diagnosis not present

## 2019-11-24 DIAGNOSIS — Z79899 Other long term (current) drug therapy: Secondary | ICD-10-CM | POA: Diagnosis not present

## 2019-11-25 DIAGNOSIS — Z5111 Encounter for antineoplastic chemotherapy: Secondary | ICD-10-CM | POA: Diagnosis not present

## 2019-11-25 DIAGNOSIS — C9200 Acute myeloblastic leukemia, not having achieved remission: Secondary | ICD-10-CM | POA: Diagnosis not present

## 2019-11-26 DIAGNOSIS — Z79899 Other long term (current) drug therapy: Secondary | ICD-10-CM | POA: Diagnosis not present

## 2019-11-26 DIAGNOSIS — C9200 Acute myeloblastic leukemia, not having achieved remission: Secondary | ICD-10-CM | POA: Diagnosis not present

## 2019-11-26 DIAGNOSIS — Z5111 Encounter for antineoplastic chemotherapy: Secondary | ICD-10-CM | POA: Diagnosis not present

## 2019-11-27 DIAGNOSIS — C9200 Acute myeloblastic leukemia, not having achieved remission: Secondary | ICD-10-CM | POA: Diagnosis not present

## 2019-11-28 DIAGNOSIS — Z79899 Other long term (current) drug therapy: Secondary | ICD-10-CM | POA: Diagnosis not present

## 2019-11-28 DIAGNOSIS — C9200 Acute myeloblastic leukemia, not having achieved remission: Secondary | ICD-10-CM | POA: Diagnosis not present

## 2019-11-28 DIAGNOSIS — Z5111 Encounter for antineoplastic chemotherapy: Secondary | ICD-10-CM | POA: Diagnosis not present

## 2019-12-01 DIAGNOSIS — Z7189 Other specified counseling: Secondary | ICD-10-CM | POA: Diagnosis not present

## 2019-12-01 DIAGNOSIS — I2584 Coronary atherosclerosis due to calcified coronary lesion: Secondary | ICD-10-CM | POA: Diagnosis not present

## 2019-12-01 DIAGNOSIS — C9202 Acute myeloblastic leukemia, in relapse: Secondary | ICD-10-CM | POA: Diagnosis not present

## 2019-12-01 DIAGNOSIS — M061 Adult-onset Still's disease: Secondary | ICD-10-CM | POA: Diagnosis not present

## 2019-12-01 DIAGNOSIS — Z7682 Awaiting organ transplant status: Secondary | ICD-10-CM | POA: Diagnosis not present

## 2019-12-01 DIAGNOSIS — Z9221 Personal history of antineoplastic chemotherapy: Secondary | ICD-10-CM | POA: Diagnosis not present

## 2019-12-01 DIAGNOSIS — B181 Chronic viral hepatitis B without delta-agent: Secondary | ICD-10-CM | POA: Diagnosis not present

## 2019-12-01 DIAGNOSIS — R110 Nausea: Secondary | ICD-10-CM | POA: Diagnosis not present

## 2019-12-01 DIAGNOSIS — Z79899 Other long term (current) drug therapy: Secondary | ICD-10-CM | POA: Diagnosis not present

## 2019-12-04 DIAGNOSIS — R110 Nausea: Secondary | ICD-10-CM | POA: Diagnosis not present

## 2019-12-04 DIAGNOSIS — R5383 Other fatigue: Secondary | ICD-10-CM | POA: Diagnosis not present

## 2019-12-04 DIAGNOSIS — Z79899 Other long term (current) drug therapy: Secondary | ICD-10-CM | POA: Diagnosis not present

## 2019-12-04 DIAGNOSIS — C9200 Acute myeloblastic leukemia, not having achieved remission: Secondary | ICD-10-CM | POA: Diagnosis not present

## 2019-12-04 DIAGNOSIS — Z95828 Presence of other vascular implants and grafts: Secondary | ICD-10-CM | POA: Diagnosis not present

## 2019-12-04 DIAGNOSIS — K5900 Constipation, unspecified: Secondary | ICD-10-CM | POA: Diagnosis not present

## 2019-12-04 DIAGNOSIS — R911 Solitary pulmonary nodule: Secondary | ICD-10-CM | POA: Diagnosis not present

## 2019-12-04 DIAGNOSIS — M061 Adult-onset Still's disease: Secondary | ICD-10-CM | POA: Diagnosis not present

## 2019-12-04 DIAGNOSIS — D709 Neutropenia, unspecified: Secondary | ICD-10-CM | POA: Diagnosis not present

## 2019-12-08 DIAGNOSIS — C9200 Acute myeloblastic leukemia, not having achieved remission: Secondary | ICD-10-CM | POA: Diagnosis not present

## 2019-12-09 DIAGNOSIS — C9200 Acute myeloblastic leukemia, not having achieved remission: Secondary | ICD-10-CM | POA: Diagnosis not present

## 2019-12-09 DIAGNOSIS — Z9221 Personal history of antineoplastic chemotherapy: Secondary | ICD-10-CM | POA: Diagnosis not present

## 2019-12-11 DIAGNOSIS — Z79899 Other long term (current) drug therapy: Secondary | ICD-10-CM | POA: Diagnosis not present

## 2019-12-11 DIAGNOSIS — C9200 Acute myeloblastic leukemia, not having achieved remission: Secondary | ICD-10-CM | POA: Diagnosis not present

## 2019-12-11 DIAGNOSIS — D61818 Other pancytopenia: Secondary | ICD-10-CM | POA: Diagnosis not present

## 2019-12-11 DIAGNOSIS — R918 Other nonspecific abnormal finding of lung field: Secondary | ICD-10-CM | POA: Diagnosis not present

## 2019-12-11 DIAGNOSIS — M061 Adult-onset Still's disease: Secondary | ICD-10-CM | POA: Diagnosis not present

## 2019-12-11 DIAGNOSIS — S065X9D Traumatic subdural hemorrhage with loss of consciousness of unspecified duration, subsequent encounter: Secondary | ICD-10-CM | POA: Diagnosis not present

## 2019-12-11 DIAGNOSIS — R110 Nausea: Secondary | ICD-10-CM | POA: Diagnosis not present

## 2019-12-11 DIAGNOSIS — D709 Neutropenia, unspecified: Secondary | ICD-10-CM | POA: Diagnosis not present

## 2019-12-11 DIAGNOSIS — I6200 Nontraumatic subdural hemorrhage, unspecified: Secondary | ICD-10-CM | POA: Diagnosis not present

## 2019-12-11 DIAGNOSIS — K5900 Constipation, unspecified: Secondary | ICD-10-CM | POA: Diagnosis not present

## 2019-12-15 DIAGNOSIS — C9200 Acute myeloblastic leukemia, not having achieved remission: Secondary | ICD-10-CM | POA: Diagnosis not present

## 2019-12-18 DIAGNOSIS — C9200 Acute myeloblastic leukemia, not having achieved remission: Secondary | ICD-10-CM | POA: Diagnosis not present

## 2019-12-21 DIAGNOSIS — C9200 Acute myeloblastic leukemia, not having achieved remission: Secondary | ICD-10-CM | POA: Diagnosis not present

## 2019-12-21 DIAGNOSIS — Z7682 Awaiting organ transplant status: Secondary | ICD-10-CM | POA: Diagnosis not present

## 2019-12-22 DIAGNOSIS — K5900 Constipation, unspecified: Secondary | ICD-10-CM | POA: Diagnosis not present

## 2019-12-22 DIAGNOSIS — M061 Adult-onset Still's disease: Secondary | ICD-10-CM | POA: Diagnosis not present

## 2019-12-22 DIAGNOSIS — C9200 Acute myeloblastic leukemia, not having achieved remission: Secondary | ICD-10-CM | POA: Diagnosis not present

## 2019-12-22 DIAGNOSIS — Z95828 Presence of other vascular implants and grafts: Secondary | ICD-10-CM | POA: Diagnosis not present

## 2019-12-22 DIAGNOSIS — I6200 Nontraumatic subdural hemorrhage, unspecified: Secondary | ICD-10-CM | POA: Diagnosis not present

## 2019-12-22 DIAGNOSIS — L03211 Cellulitis of face: Secondary | ICD-10-CM | POA: Diagnosis not present

## 2019-12-22 DIAGNOSIS — Z79899 Other long term (current) drug therapy: Secondary | ICD-10-CM | POA: Diagnosis not present

## 2019-12-22 DIAGNOSIS — K5289 Other specified noninfective gastroenteritis and colitis: Secondary | ICD-10-CM | POA: Diagnosis not present

## 2019-12-22 DIAGNOSIS — D709 Neutropenia, unspecified: Secondary | ICD-10-CM | POA: Diagnosis not present

## 2019-12-23 DIAGNOSIS — Z5111 Encounter for antineoplastic chemotherapy: Secondary | ICD-10-CM | POA: Diagnosis not present

## 2019-12-23 DIAGNOSIS — C9200 Acute myeloblastic leukemia, not having achieved remission: Secondary | ICD-10-CM | POA: Diagnosis not present

## 2019-12-24 DIAGNOSIS — Z5111 Encounter for antineoplastic chemotherapy: Secondary | ICD-10-CM | POA: Diagnosis not present

## 2019-12-24 DIAGNOSIS — C9200 Acute myeloblastic leukemia, not having achieved remission: Secondary | ICD-10-CM | POA: Diagnosis not present

## 2019-12-25 DIAGNOSIS — C9200 Acute myeloblastic leukemia, not having achieved remission: Secondary | ICD-10-CM | POA: Diagnosis not present

## 2019-12-25 DIAGNOSIS — Z5111 Encounter for antineoplastic chemotherapy: Secondary | ICD-10-CM | POA: Diagnosis not present

## 2019-12-26 DIAGNOSIS — C9200 Acute myeloblastic leukemia, not having achieved remission: Secondary | ICD-10-CM | POA: Diagnosis not present

## 2019-12-26 DIAGNOSIS — Z5111 Encounter for antineoplastic chemotherapy: Secondary | ICD-10-CM | POA: Diagnosis not present

## 2019-12-29 DIAGNOSIS — R918 Other nonspecific abnormal finding of lung field: Secondary | ICD-10-CM | POA: Diagnosis not present

## 2019-12-29 DIAGNOSIS — M25511 Pain in right shoulder: Secondary | ICD-10-CM | POA: Diagnosis not present

## 2019-12-29 DIAGNOSIS — M25512 Pain in left shoulder: Secondary | ICD-10-CM | POA: Diagnosis not present

## 2019-12-29 DIAGNOSIS — R911 Solitary pulmonary nodule: Secondary | ICD-10-CM | POA: Diagnosis not present

## 2019-12-29 DIAGNOSIS — M061 Adult-onset Still's disease: Secondary | ICD-10-CM | POA: Diagnosis not present

## 2019-12-29 DIAGNOSIS — C9200 Acute myeloblastic leukemia, not having achieved remission: Secondary | ICD-10-CM | POA: Diagnosis not present

## 2019-12-29 DIAGNOSIS — S065X9D Traumatic subdural hemorrhage with loss of consciousness of unspecified duration, subsequent encounter: Secondary | ICD-10-CM | POA: Diagnosis not present

## 2019-12-29 DIAGNOSIS — M25611 Stiffness of right shoulder, not elsewhere classified: Secondary | ICD-10-CM | POA: Diagnosis not present

## 2019-12-29 DIAGNOSIS — I951 Orthostatic hypotension: Secondary | ICD-10-CM | POA: Diagnosis not present

## 2019-12-29 DIAGNOSIS — K5900 Constipation, unspecified: Secondary | ICD-10-CM | POA: Diagnosis not present

## 2019-12-29 DIAGNOSIS — M25612 Stiffness of left shoulder, not elsewhere classified: Secondary | ICD-10-CM | POA: Diagnosis not present

## 2019-12-29 DIAGNOSIS — Z95828 Presence of other vascular implants and grafts: Secondary | ICD-10-CM | POA: Diagnosis not present

## 2020-01-05 DIAGNOSIS — Z79899 Other long term (current) drug therapy: Secondary | ICD-10-CM | POA: Diagnosis not present

## 2020-01-05 DIAGNOSIS — K140 Glossitis: Secondary | ICD-10-CM | POA: Diagnosis not present

## 2020-01-05 DIAGNOSIS — C9200 Acute myeloblastic leukemia, not having achieved remission: Secondary | ICD-10-CM | POA: Diagnosis not present

## 2020-01-05 DIAGNOSIS — T451X5D Adverse effect of antineoplastic and immunosuppressive drugs, subsequent encounter: Secondary | ICD-10-CM | POA: Diagnosis not present

## 2020-01-05 DIAGNOSIS — K5903 Drug induced constipation: Secondary | ICD-10-CM | POA: Diagnosis not present

## 2020-01-05 DIAGNOSIS — Z5111 Encounter for antineoplastic chemotherapy: Secondary | ICD-10-CM | POA: Diagnosis not present

## 2020-01-05 DIAGNOSIS — D709 Neutropenia, unspecified: Secondary | ICD-10-CM | POA: Diagnosis not present

## 2020-01-05 DIAGNOSIS — M061 Adult-onset Still's disease: Secondary | ICD-10-CM | POA: Diagnosis not present

## 2020-01-07 DIAGNOSIS — D61810 Antineoplastic chemotherapy induced pancytopenia: Secondary | ICD-10-CM | POA: Diagnosis present

## 2020-01-07 DIAGNOSIS — R5081 Fever presenting with conditions classified elsewhere: Secondary | ICD-10-CM | POA: Diagnosis present

## 2020-01-07 DIAGNOSIS — C9200 Acute myeloblastic leukemia, not having achieved remission: Secondary | ICD-10-CM | POA: Diagnosis present

## 2020-01-07 DIAGNOSIS — I6200 Nontraumatic subdural hemorrhage, unspecified: Secondary | ICD-10-CM | POA: Diagnosis not present

## 2020-01-07 DIAGNOSIS — Z79899 Other long term (current) drug therapy: Secondary | ICD-10-CM | POA: Diagnosis not present

## 2020-01-07 DIAGNOSIS — Z8619 Personal history of other infectious and parasitic diseases: Secondary | ICD-10-CM | POA: Diagnosis not present

## 2020-01-07 DIAGNOSIS — T451X5A Adverse effect of antineoplastic and immunosuppressive drugs, initial encounter: Secondary | ICD-10-CM | POA: Diagnosis present

## 2020-01-07 DIAGNOSIS — B999 Unspecified infectious disease: Secondary | ICD-10-CM | POA: Diagnosis present

## 2020-01-07 DIAGNOSIS — Z8679 Personal history of other diseases of the circulatory system: Secondary | ICD-10-CM | POA: Diagnosis not present

## 2020-01-07 DIAGNOSIS — E785 Hyperlipidemia, unspecified: Secondary | ICD-10-CM | POA: Diagnosis present

## 2020-01-07 DIAGNOSIS — N400 Enlarged prostate without lower urinary tract symptoms: Secondary | ICD-10-CM | POA: Diagnosis present

## 2020-01-07 DIAGNOSIS — K140 Glossitis: Secondary | ICD-10-CM | POA: Diagnosis present

## 2020-01-07 DIAGNOSIS — D709 Neutropenia, unspecified: Secondary | ICD-10-CM | POA: Diagnosis present

## 2020-01-07 DIAGNOSIS — R197 Diarrhea, unspecified: Secondary | ICD-10-CM | POA: Diagnosis not present

## 2020-01-07 DIAGNOSIS — H509 Unspecified strabismus: Secondary | ICD-10-CM | POA: Diagnosis present

## 2020-01-10 DIAGNOSIS — D709 Neutropenia, unspecified: Secondary | ICD-10-CM | POA: Diagnosis present

## 2020-01-10 DIAGNOSIS — Z8619 Personal history of other infectious and parasitic diseases: Secondary | ICD-10-CM | POA: Diagnosis not present

## 2020-01-10 DIAGNOSIS — K140 Glossitis: Secondary | ICD-10-CM | POA: Diagnosis present

## 2020-01-10 DIAGNOSIS — N400 Enlarged prostate without lower urinary tract symptoms: Secondary | ICD-10-CM | POA: Diagnosis present

## 2020-01-10 DIAGNOSIS — T451X5A Adverse effect of antineoplastic and immunosuppressive drugs, initial encounter: Secondary | ICD-10-CM | POA: Diagnosis present

## 2020-01-10 DIAGNOSIS — D61810 Antineoplastic chemotherapy induced pancytopenia: Secondary | ICD-10-CM | POA: Diagnosis present

## 2020-01-10 DIAGNOSIS — E785 Hyperlipidemia, unspecified: Secondary | ICD-10-CM | POA: Diagnosis present

## 2020-01-10 DIAGNOSIS — R5081 Fever presenting with conditions classified elsewhere: Secondary | ICD-10-CM | POA: Diagnosis present

## 2020-01-10 DIAGNOSIS — H509 Unspecified strabismus: Secondary | ICD-10-CM | POA: Diagnosis present

## 2020-01-10 DIAGNOSIS — Z8679 Personal history of other diseases of the circulatory system: Secondary | ICD-10-CM | POA: Diagnosis not present

## 2020-01-10 DIAGNOSIS — I6200 Nontraumatic subdural hemorrhage, unspecified: Secondary | ICD-10-CM | POA: Diagnosis not present

## 2020-01-10 DIAGNOSIS — C9200 Acute myeloblastic leukemia, not having achieved remission: Secondary | ICD-10-CM | POA: Diagnosis present

## 2020-01-10 DIAGNOSIS — R197 Diarrhea, unspecified: Secondary | ICD-10-CM | POA: Diagnosis not present

## 2020-01-10 DIAGNOSIS — Z79899 Other long term (current) drug therapy: Secondary | ICD-10-CM | POA: Diagnosis not present

## 2020-01-10 DIAGNOSIS — B999 Unspecified infectious disease: Secondary | ICD-10-CM | POA: Diagnosis present

## 2020-01-19 DIAGNOSIS — S065X9D Traumatic subdural hemorrhage with loss of consciousness of unspecified duration, subsequent encounter: Secondary | ICD-10-CM | POA: Diagnosis not present

## 2020-01-19 DIAGNOSIS — R110 Nausea: Secondary | ICD-10-CM | POA: Diagnosis not present

## 2020-01-19 DIAGNOSIS — C9200 Acute myeloblastic leukemia, not having achieved remission: Secondary | ICD-10-CM | POA: Diagnosis not present

## 2020-01-19 DIAGNOSIS — M0820 Juvenile rheumatoid arthritis with systemic onset, unspecified site: Secondary | ICD-10-CM | POA: Diagnosis not present

## 2020-01-19 DIAGNOSIS — K5903 Drug induced constipation: Secondary | ICD-10-CM | POA: Diagnosis not present

## 2020-01-19 DIAGNOSIS — Z95828 Presence of other vascular implants and grafts: Secondary | ICD-10-CM | POA: Diagnosis not present

## 2020-01-19 DIAGNOSIS — K5900 Constipation, unspecified: Secondary | ICD-10-CM | POA: Diagnosis not present

## 2020-01-20 DIAGNOSIS — C9200 Acute myeloblastic leukemia, not having achieved remission: Secondary | ICD-10-CM | POA: Diagnosis not present

## 2020-01-20 DIAGNOSIS — Z7682 Awaiting organ transplant status: Secondary | ICD-10-CM | POA: Diagnosis not present

## 2020-01-26 DIAGNOSIS — Z79899 Other long term (current) drug therapy: Secondary | ICD-10-CM | POA: Diagnosis not present

## 2020-01-26 DIAGNOSIS — C9201 Acute myeloblastic leukemia, in remission: Secondary | ICD-10-CM | POA: Diagnosis not present

## 2020-01-26 DIAGNOSIS — Z01810 Encounter for preprocedural cardiovascular examination: Secondary | ICD-10-CM | POA: Diagnosis not present

## 2020-01-26 DIAGNOSIS — D499 Neoplasm of unspecified behavior of unspecified site: Secondary | ICD-10-CM | POA: Diagnosis not present

## 2020-01-26 DIAGNOSIS — R530 Neoplastic (malignant) related fatigue: Secondary | ICD-10-CM | POA: Diagnosis not present

## 2020-01-29 DIAGNOSIS — C9200 Acute myeloblastic leukemia, not having achieved remission: Secondary | ICD-10-CM | POA: Diagnosis not present

## 2020-01-29 DIAGNOSIS — C9201 Acute myeloblastic leukemia, in remission: Secondary | ICD-10-CM | POA: Diagnosis not present

## 2020-02-02 DIAGNOSIS — R110 Nausea: Secondary | ICD-10-CM | POA: Diagnosis not present

## 2020-02-02 DIAGNOSIS — C9201 Acute myeloblastic leukemia, in remission: Secondary | ICD-10-CM | POA: Diagnosis not present

## 2020-02-02 DIAGNOSIS — Z95828 Presence of other vascular implants and grafts: Secondary | ICD-10-CM | POA: Diagnosis not present

## 2020-02-02 DIAGNOSIS — M25561 Pain in right knee: Secondary | ICD-10-CM | POA: Diagnosis not present

## 2020-02-02 DIAGNOSIS — C9200 Acute myeloblastic leukemia, not having achieved remission: Secondary | ICD-10-CM | POA: Diagnosis not present

## 2020-02-02 DIAGNOSIS — R42 Dizziness and giddiness: Secondary | ICD-10-CM | POA: Diagnosis not present

## 2020-02-02 DIAGNOSIS — R0602 Shortness of breath: Secondary | ICD-10-CM | POA: Diagnosis not present

## 2020-02-02 DIAGNOSIS — R5383 Other fatigue: Secondary | ICD-10-CM | POA: Diagnosis not present

## 2020-02-02 DIAGNOSIS — Z79899 Other long term (current) drug therapy: Secondary | ICD-10-CM | POA: Diagnosis not present

## 2020-02-02 DIAGNOSIS — K5289 Other specified noninfective gastroenteritis and colitis: Secondary | ICD-10-CM | POA: Diagnosis not present

## 2020-02-03 DIAGNOSIS — Z5111 Encounter for antineoplastic chemotherapy: Secondary | ICD-10-CM | POA: Diagnosis not present

## 2020-02-03 DIAGNOSIS — C9200 Acute myeloblastic leukemia, not having achieved remission: Secondary | ICD-10-CM | POA: Diagnosis not present

## 2020-02-04 DIAGNOSIS — C9200 Acute myeloblastic leukemia, not having achieved remission: Secondary | ICD-10-CM | POA: Diagnosis not present

## 2020-02-04 DIAGNOSIS — Z5111 Encounter for antineoplastic chemotherapy: Secondary | ICD-10-CM | POA: Diagnosis not present

## 2020-02-05 DIAGNOSIS — Z5111 Encounter for antineoplastic chemotherapy: Secondary | ICD-10-CM | POA: Diagnosis not present

## 2020-02-05 DIAGNOSIS — C9200 Acute myeloblastic leukemia, not having achieved remission: Secondary | ICD-10-CM | POA: Diagnosis not present

## 2020-02-06 DIAGNOSIS — Z5111 Encounter for antineoplastic chemotherapy: Secondary | ICD-10-CM | POA: Diagnosis not present

## 2020-02-06 DIAGNOSIS — C9200 Acute myeloblastic leukemia, not having achieved remission: Secondary | ICD-10-CM | POA: Diagnosis not present

## 2020-02-09 DIAGNOSIS — C9201 Acute myeloblastic leukemia, in remission: Secondary | ICD-10-CM | POA: Diagnosis not present

## 2020-02-09 DIAGNOSIS — R918 Other nonspecific abnormal finding of lung field: Secondary | ICD-10-CM | POA: Diagnosis not present

## 2020-02-09 DIAGNOSIS — S065X0A Traumatic subdural hemorrhage without loss of consciousness, initial encounter: Secondary | ICD-10-CM | POA: Diagnosis not present

## 2020-02-09 DIAGNOSIS — Z95828 Presence of other vascular implants and grafts: Secondary | ICD-10-CM | POA: Diagnosis not present

## 2020-02-09 DIAGNOSIS — C9200 Acute myeloblastic leukemia, not having achieved remission: Secondary | ICD-10-CM | POA: Diagnosis not present

## 2020-02-09 DIAGNOSIS — Z79899 Other long term (current) drug therapy: Secondary | ICD-10-CM | POA: Diagnosis not present

## 2020-02-09 DIAGNOSIS — K5900 Constipation, unspecified: Secondary | ICD-10-CM | POA: Diagnosis not present

## 2020-02-09 DIAGNOSIS — R110 Nausea: Secondary | ICD-10-CM | POA: Diagnosis not present

## 2020-02-16 DIAGNOSIS — R110 Nausea: Secondary | ICD-10-CM | POA: Diagnosis not present

## 2020-02-16 DIAGNOSIS — C9201 Acute myeloblastic leukemia, in remission: Secondary | ICD-10-CM | POA: Diagnosis not present

## 2020-02-16 DIAGNOSIS — D696 Thrombocytopenia, unspecified: Secondary | ICD-10-CM | POA: Diagnosis not present

## 2020-02-16 DIAGNOSIS — Z9484 Stem cells transplant status: Secondary | ICD-10-CM | POA: Diagnosis not present

## 2020-02-16 DIAGNOSIS — R911 Solitary pulmonary nodule: Secondary | ICD-10-CM | POA: Diagnosis not present

## 2020-02-16 DIAGNOSIS — K5900 Constipation, unspecified: Secondary | ICD-10-CM | POA: Diagnosis not present

## 2020-02-16 DIAGNOSIS — K5289 Other specified noninfective gastroenteritis and colitis: Secondary | ICD-10-CM | POA: Diagnosis not present

## 2020-02-16 DIAGNOSIS — Z95828 Presence of other vascular implants and grafts: Secondary | ICD-10-CM | POA: Diagnosis not present

## 2020-02-16 DIAGNOSIS — Z79899 Other long term (current) drug therapy: Secondary | ICD-10-CM | POA: Diagnosis not present

## 2020-02-19 DIAGNOSIS — C9201 Acute myeloblastic leukemia, in remission: Secondary | ICD-10-CM | POA: Diagnosis not present

## 2020-02-23 DIAGNOSIS — Z01818 Encounter for other preprocedural examination: Secondary | ICD-10-CM | POA: Diagnosis not present

## 2020-02-23 DIAGNOSIS — M061 Adult-onset Still's disease: Secondary | ICD-10-CM | POA: Diagnosis not present

## 2020-02-23 DIAGNOSIS — K5289 Other specified noninfective gastroenteritis and colitis: Secondary | ICD-10-CM | POA: Diagnosis not present

## 2020-02-23 DIAGNOSIS — D701 Agranulocytosis secondary to cancer chemotherapy: Secondary | ICD-10-CM | POA: Diagnosis not present

## 2020-02-23 DIAGNOSIS — R110 Nausea: Secondary | ICD-10-CM | POA: Diagnosis not present

## 2020-02-23 DIAGNOSIS — C9201 Acute myeloblastic leukemia, in remission: Secondary | ICD-10-CM | POA: Diagnosis not present

## 2020-02-23 DIAGNOSIS — C9200 Acute myeloblastic leukemia, not having achieved remission: Secondary | ICD-10-CM | POA: Diagnosis not present

## 2020-02-23 DIAGNOSIS — R530 Neoplastic (malignant) related fatigue: Secondary | ICD-10-CM | POA: Diagnosis not present

## 2020-02-23 DIAGNOSIS — Z95828 Presence of other vascular implants and grafts: Secondary | ICD-10-CM | POA: Diagnosis not present

## 2020-02-23 DIAGNOSIS — K5903 Drug induced constipation: Secondary | ICD-10-CM | POA: Diagnosis not present

## 2020-02-23 DIAGNOSIS — K5900 Constipation, unspecified: Secondary | ICD-10-CM | POA: Diagnosis not present

## 2020-02-25 DIAGNOSIS — Z01818 Encounter for other preprocedural examination: Secondary | ICD-10-CM | POA: Diagnosis not present

## 2020-02-25 DIAGNOSIS — Z7682 Awaiting organ transplant status: Secondary | ICD-10-CM | POA: Diagnosis not present

## 2020-02-25 DIAGNOSIS — C9200 Acute myeloblastic leukemia, not having achieved remission: Secondary | ICD-10-CM | POA: Diagnosis not present

## 2020-02-26 DIAGNOSIS — C9200 Acute myeloblastic leukemia, not having achieved remission: Secondary | ICD-10-CM | POA: Diagnosis not present

## 2020-02-29 DIAGNOSIS — C9200 Acute myeloblastic leukemia, not having achieved remission: Secondary | ICD-10-CM | POA: Diagnosis not present

## 2020-03-01 DIAGNOSIS — M061 Adult-onset Still's disease: Secondary | ICD-10-CM | POA: Diagnosis not present

## 2020-03-01 DIAGNOSIS — D709 Neutropenia, unspecified: Secondary | ICD-10-CM | POA: Diagnosis not present

## 2020-03-01 DIAGNOSIS — K5289 Other specified noninfective gastroenteritis and colitis: Secondary | ICD-10-CM | POA: Diagnosis not present

## 2020-03-01 DIAGNOSIS — K5903 Drug induced constipation: Secondary | ICD-10-CM | POA: Diagnosis not present

## 2020-03-01 DIAGNOSIS — R110 Nausea: Secondary | ICD-10-CM | POA: Diagnosis not present

## 2020-03-01 DIAGNOSIS — C9200 Acute myeloblastic leukemia, not having achieved remission: Secondary | ICD-10-CM | POA: Diagnosis not present

## 2020-03-01 DIAGNOSIS — D708 Other neutropenia: Secondary | ICD-10-CM | POA: Diagnosis not present

## 2020-03-01 DIAGNOSIS — R918 Other nonspecific abnormal finding of lung field: Secondary | ICD-10-CM | POA: Diagnosis not present

## 2020-03-01 DIAGNOSIS — K5900 Constipation, unspecified: Secondary | ICD-10-CM | POA: Diagnosis not present

## 2020-03-01 DIAGNOSIS — Z95828 Presence of other vascular implants and grafts: Secondary | ICD-10-CM | POA: Diagnosis not present

## 2020-03-08 DIAGNOSIS — Z79899 Other long term (current) drug therapy: Secondary | ICD-10-CM | POA: Diagnosis not present

## 2020-03-08 DIAGNOSIS — Z9484 Stem cells transplant status: Secondary | ICD-10-CM | POA: Diagnosis not present

## 2020-03-08 DIAGNOSIS — Z01818 Encounter for other preprocedural examination: Secondary | ICD-10-CM | POA: Diagnosis not present

## 2020-03-08 DIAGNOSIS — C9200 Acute myeloblastic leukemia, not having achieved remission: Secondary | ICD-10-CM | POA: Diagnosis not present

## 2020-03-09 DIAGNOSIS — C9200 Acute myeloblastic leukemia, not having achieved remission: Secondary | ICD-10-CM | POA: Diagnosis not present

## 2020-03-10 DIAGNOSIS — C9200 Acute myeloblastic leukemia, not having achieved remission: Secondary | ICD-10-CM | POA: Diagnosis not present

## 2020-03-11 DIAGNOSIS — C9201 Acute myeloblastic leukemia, in remission: Secondary | ICD-10-CM | POA: Diagnosis not present

## 2020-03-11 DIAGNOSIS — Z9481 Bone marrow transplant status: Secondary | ICD-10-CM | POA: Diagnosis not present

## 2020-03-15 DIAGNOSIS — Z95828 Presence of other vascular implants and grafts: Secondary | ICD-10-CM | POA: Diagnosis not present

## 2020-03-15 DIAGNOSIS — E785 Hyperlipidemia, unspecified: Secondary | ICD-10-CM | POA: Diagnosis present

## 2020-03-15 DIAGNOSIS — I6203 Nontraumatic chronic subdural hemorrhage: Secondary | ICD-10-CM | POA: Diagnosis present

## 2020-03-15 DIAGNOSIS — R630 Anorexia: Secondary | ICD-10-CM | POA: Diagnosis not present

## 2020-03-15 DIAGNOSIS — M79671 Pain in right foot: Secondary | ICD-10-CM | POA: Diagnosis not present

## 2020-03-15 DIAGNOSIS — M79672 Pain in left foot: Secondary | ICD-10-CM | POA: Diagnosis not present

## 2020-03-15 DIAGNOSIS — N400 Enlarged prostate without lower urinary tract symptoms: Secondary | ICD-10-CM | POA: Diagnosis present

## 2020-03-15 DIAGNOSIS — K2100 Gastro-esophageal reflux disease with esophagitis, without bleeding: Secondary | ICD-10-CM | POA: Diagnosis not present

## 2020-03-15 DIAGNOSIS — R918 Other nonspecific abnormal finding of lung field: Secondary | ICD-10-CM | POA: Diagnosis not present

## 2020-03-15 DIAGNOSIS — E872 Acidosis: Secondary | ICD-10-CM | POA: Diagnosis not present

## 2020-03-15 DIAGNOSIS — A0472 Enterocolitis due to Clostridium difficile, not specified as recurrent: Secondary | ICD-10-CM | POA: Diagnosis not present

## 2020-03-15 DIAGNOSIS — R0902 Hypoxemia: Secondary | ICD-10-CM | POA: Diagnosis not present

## 2020-03-15 DIAGNOSIS — R519 Headache, unspecified: Secondary | ICD-10-CM | POA: Diagnosis not present

## 2020-03-15 DIAGNOSIS — R197 Diarrhea, unspecified: Secondary | ICD-10-CM | POA: Diagnosis not present

## 2020-03-15 DIAGNOSIS — Z9484 Stem cells transplant status: Secondary | ICD-10-CM | POA: Diagnosis not present

## 2020-03-15 DIAGNOSIS — L271 Localized skin eruption due to drugs and medicaments taken internally: Secondary | ICD-10-CM | POA: Diagnosis not present

## 2020-03-15 DIAGNOSIS — H40009 Preglaucoma, unspecified, unspecified eye: Secondary | ICD-10-CM | POA: Diagnosis present

## 2020-03-15 DIAGNOSIS — E870 Hyperosmolality and hypernatremia: Secondary | ICD-10-CM | POA: Diagnosis not present

## 2020-03-15 DIAGNOSIS — R911 Solitary pulmonary nodule: Secondary | ICD-10-CM | POA: Diagnosis not present

## 2020-03-15 DIAGNOSIS — S065X9A Traumatic subdural hemorrhage with loss of consciousness of unspecified duration, initial encounter: Secondary | ICD-10-CM | POA: Diagnosis not present

## 2020-03-15 DIAGNOSIS — Z8679 Personal history of other diseases of the circulatory system: Secondary | ICD-10-CM | POA: Diagnosis not present

## 2020-03-15 DIAGNOSIS — R112 Nausea with vomiting, unspecified: Secondary | ICD-10-CM | POA: Diagnosis not present

## 2020-03-15 DIAGNOSIS — I6200 Nontraumatic subdural hemorrhage, unspecified: Secondary | ICD-10-CM | POA: Diagnosis not present

## 2020-03-15 DIAGNOSIS — G893 Neoplasm related pain (acute) (chronic): Secondary | ICD-10-CM | POA: Diagnosis not present

## 2020-03-15 DIAGNOSIS — M061 Adult-onset Still's disease: Secondary | ICD-10-CM | POA: Diagnosis present

## 2020-03-15 DIAGNOSIS — L539 Erythematous condition, unspecified: Secondary | ICD-10-CM | POA: Diagnosis not present

## 2020-03-15 DIAGNOSIS — K1230 Oral mucositis (ulcerative), unspecified: Secondary | ICD-10-CM | POA: Diagnosis not present

## 2020-03-15 DIAGNOSIS — Z79899 Other long term (current) drug therapy: Secondary | ICD-10-CM | POA: Diagnosis not present

## 2020-03-15 DIAGNOSIS — C9200 Acute myeloblastic leukemia, not having achieved remission: Secondary | ICD-10-CM | POA: Diagnosis not present

## 2020-03-15 DIAGNOSIS — K219 Gastro-esophageal reflux disease without esophagitis: Secondary | ICD-10-CM | POA: Diagnosis present

## 2020-03-15 DIAGNOSIS — N179 Acute kidney failure, unspecified: Secondary | ICD-10-CM | POA: Diagnosis not present

## 2020-03-15 DIAGNOSIS — R110 Nausea: Secondary | ICD-10-CM | POA: Diagnosis not present

## 2020-03-15 DIAGNOSIS — C9201 Acute myeloblastic leukemia, in remission: Secondary | ICD-10-CM | POA: Diagnosis present

## 2020-03-15 DIAGNOSIS — R32 Unspecified urinary incontinence: Secondary | ICD-10-CM | POA: Diagnosis not present

## 2020-03-15 DIAGNOSIS — D61810 Antineoplastic chemotherapy induced pancytopenia: Secondary | ICD-10-CM | POA: Diagnosis present

## 2020-03-15 DIAGNOSIS — R638 Other symptoms and signs concerning food and fluid intake: Secondary | ICD-10-CM | POA: Diagnosis not present

## 2020-03-15 DIAGNOSIS — R5081 Fever presenting with conditions classified elsewhere: Secondary | ICD-10-CM | POA: Diagnosis not present

## 2020-03-15 DIAGNOSIS — R5381 Other malaise: Secondary | ICD-10-CM | POA: Diagnosis not present

## 2020-03-15 DIAGNOSIS — Z4659 Encounter for fitting and adjustment of other gastrointestinal appliance and device: Secondary | ICD-10-CM | POA: Diagnosis not present

## 2020-03-15 DIAGNOSIS — Z515 Encounter for palliative care: Secondary | ICD-10-CM | POA: Diagnosis not present

## 2020-03-15 DIAGNOSIS — Z20822 Contact with and (suspected) exposure to covid-19: Secondary | ICD-10-CM | POA: Diagnosis present

## 2020-03-15 DIAGNOSIS — R066 Hiccough: Secondary | ICD-10-CM | POA: Diagnosis not present

## 2020-03-15 DIAGNOSIS — D709 Neutropenia, unspecified: Secondary | ICD-10-CM | POA: Diagnosis not present

## 2020-03-15 DIAGNOSIS — D61818 Other pancytopenia: Secondary | ICD-10-CM | POA: Diagnosis not present

## 2020-03-15 DIAGNOSIS — T451X5A Adverse effect of antineoplastic and immunosuppressive drugs, initial encounter: Secondary | ICD-10-CM | POA: Diagnosis present

## 2020-04-07 DIAGNOSIS — C9200 Acute myeloblastic leukemia, not having achieved remission: Secondary | ICD-10-CM | POA: Diagnosis not present

## 2020-04-14 DIAGNOSIS — C9201 Acute myeloblastic leukemia, in remission: Secondary | ICD-10-CM | POA: Diagnosis not present

## 2020-04-14 DIAGNOSIS — Z79899 Other long term (current) drug therapy: Secondary | ICD-10-CM | POA: Diagnosis not present

## 2020-04-14 DIAGNOSIS — Z9481 Bone marrow transplant status: Secondary | ICD-10-CM | POA: Diagnosis not present

## 2020-04-18 DIAGNOSIS — C9201 Acute myeloblastic leukemia, in remission: Secondary | ICD-10-CM | POA: Diagnosis not present

## 2020-04-18 DIAGNOSIS — Z9481 Bone marrow transplant status: Secondary | ICD-10-CM | POA: Diagnosis not present

## 2020-04-18 DIAGNOSIS — Z792 Long term (current) use of antibiotics: Secondary | ICD-10-CM | POA: Diagnosis not present

## 2020-04-18 DIAGNOSIS — Z20822 Contact with and (suspected) exposure to covid-19: Secondary | ICD-10-CM | POA: Diagnosis not present

## 2020-04-18 DIAGNOSIS — Z79899 Other long term (current) drug therapy: Secondary | ICD-10-CM | POA: Diagnosis not present

## 2020-04-18 DIAGNOSIS — C9200 Acute myeloblastic leukemia, not having achieved remission: Secondary | ICD-10-CM | POA: Diagnosis not present

## 2020-04-18 DIAGNOSIS — Z7952 Long term (current) use of systemic steroids: Secondary | ICD-10-CM | POA: Diagnosis not present

## 2020-04-21 DIAGNOSIS — Z9484 Stem cells transplant status: Secondary | ICD-10-CM | POA: Diagnosis not present

## 2020-04-21 DIAGNOSIS — Z792 Long term (current) use of antibiotics: Secondary | ICD-10-CM | POA: Diagnosis not present

## 2020-04-21 DIAGNOSIS — C9201 Acute myeloblastic leukemia, in remission: Secondary | ICD-10-CM | POA: Diagnosis not present

## 2020-04-21 DIAGNOSIS — C9200 Acute myeloblastic leukemia, not having achieved remission: Secondary | ICD-10-CM | POA: Diagnosis not present

## 2020-04-21 DIAGNOSIS — B009 Herpesviral infection, unspecified: Secondary | ICD-10-CM | POA: Diagnosis not present

## 2020-04-21 DIAGNOSIS — Z9481 Bone marrow transplant status: Secondary | ICD-10-CM | POA: Diagnosis not present

## 2020-04-25 DIAGNOSIS — Z79899 Other long term (current) drug therapy: Secondary | ICD-10-CM | POA: Diagnosis not present

## 2020-04-25 DIAGNOSIS — Z9481 Bone marrow transplant status: Secondary | ICD-10-CM | POA: Diagnosis not present

## 2020-04-25 DIAGNOSIS — C9200 Acute myeloblastic leukemia, not having achieved remission: Secondary | ICD-10-CM | POA: Diagnosis not present

## 2020-04-25 DIAGNOSIS — R42 Dizziness and giddiness: Secondary | ICD-10-CM | POA: Diagnosis not present

## 2020-04-25 DIAGNOSIS — Z8619 Personal history of other infectious and parasitic diseases: Secondary | ICD-10-CM | POA: Diagnosis not present

## 2020-04-25 DIAGNOSIS — R110 Nausea: Secondary | ICD-10-CM | POA: Diagnosis not present

## 2020-04-25 DIAGNOSIS — B449 Aspergillosis, unspecified: Secondary | ICD-10-CM | POA: Diagnosis not present

## 2020-04-25 DIAGNOSIS — Z9484 Stem cells transplant status: Secondary | ICD-10-CM | POA: Diagnosis not present

## 2020-04-25 DIAGNOSIS — R439 Unspecified disturbances of smell and taste: Secondary | ICD-10-CM | POA: Diagnosis not present

## 2020-04-25 DIAGNOSIS — R197 Diarrhea, unspecified: Secondary | ICD-10-CM | POA: Diagnosis not present

## 2020-04-25 DIAGNOSIS — C9201 Acute myeloblastic leukemia, in remission: Secondary | ICD-10-CM | POA: Diagnosis not present

## 2020-04-28 DIAGNOSIS — S90423D Blister (nonthermal), unspecified great toe, subsequent encounter: Secondary | ICD-10-CM | POA: Diagnosis not present

## 2020-04-28 DIAGNOSIS — Z792 Long term (current) use of antibiotics: Secondary | ICD-10-CM | POA: Diagnosis not present

## 2020-04-28 DIAGNOSIS — Z9481 Bone marrow transplant status: Secondary | ICD-10-CM | POA: Diagnosis not present

## 2020-04-28 DIAGNOSIS — Z79899 Other long term (current) drug therapy: Secondary | ICD-10-CM | POA: Diagnosis not present

## 2020-04-28 DIAGNOSIS — C9201 Acute myeloblastic leukemia, in remission: Secondary | ICD-10-CM | POA: Diagnosis not present

## 2020-05-02 DIAGNOSIS — Z792 Long term (current) use of antibiotics: Secondary | ICD-10-CM | POA: Diagnosis not present

## 2020-05-02 DIAGNOSIS — R112 Nausea with vomiting, unspecified: Secondary | ICD-10-CM | POA: Diagnosis not present

## 2020-05-02 DIAGNOSIS — Z8619 Personal history of other infectious and parasitic diseases: Secondary | ICD-10-CM | POA: Diagnosis not present

## 2020-05-02 DIAGNOSIS — Z9481 Bone marrow transplant status: Secondary | ICD-10-CM | POA: Diagnosis not present

## 2020-05-02 DIAGNOSIS — C9201 Acute myeloblastic leukemia, in remission: Secondary | ICD-10-CM | POA: Diagnosis not present

## 2020-05-02 DIAGNOSIS — D61818 Other pancytopenia: Secondary | ICD-10-CM | POA: Diagnosis not present

## 2020-05-02 DIAGNOSIS — Z79899 Other long term (current) drug therapy: Secondary | ICD-10-CM | POA: Diagnosis not present

## 2020-05-05 DIAGNOSIS — Z79899 Other long term (current) drug therapy: Secondary | ICD-10-CM | POA: Diagnosis not present

## 2020-05-05 DIAGNOSIS — Z9481 Bone marrow transplant status: Secondary | ICD-10-CM | POA: Diagnosis not present

## 2020-05-05 DIAGNOSIS — C9201 Acute myeloblastic leukemia, in remission: Secondary | ICD-10-CM | POA: Diagnosis not present

## 2020-05-10 DIAGNOSIS — Z79899 Other long term (current) drug therapy: Secondary | ICD-10-CM | POA: Diagnosis not present

## 2020-05-10 DIAGNOSIS — M25561 Pain in right knee: Secondary | ICD-10-CM | POA: Diagnosis not present

## 2020-05-10 DIAGNOSIS — C9201 Acute myeloblastic leukemia, in remission: Secondary | ICD-10-CM | POA: Diagnosis not present

## 2020-05-10 DIAGNOSIS — Z9481 Bone marrow transplant status: Secondary | ICD-10-CM | POA: Diagnosis not present

## 2020-05-10 DIAGNOSIS — N179 Acute kidney failure, unspecified: Secondary | ICD-10-CM | POA: Diagnosis not present

## 2020-05-13 DIAGNOSIS — Z9481 Bone marrow transplant status: Secondary | ICD-10-CM | POA: Diagnosis not present

## 2020-05-13 DIAGNOSIS — C9201 Acute myeloblastic leukemia, in remission: Secondary | ICD-10-CM | POA: Diagnosis not present

## 2020-05-17 DIAGNOSIS — R682 Dry mouth, unspecified: Secondary | ICD-10-CM | POA: Diagnosis not present

## 2020-05-17 DIAGNOSIS — Z9484 Stem cells transplant status: Secondary | ICD-10-CM | POA: Diagnosis not present

## 2020-05-17 DIAGNOSIS — Z8619 Personal history of other infectious and parasitic diseases: Secondary | ICD-10-CM | POA: Diagnosis not present

## 2020-05-17 DIAGNOSIS — Z792 Long term (current) use of antibiotics: Secondary | ICD-10-CM | POA: Diagnosis not present

## 2020-05-17 DIAGNOSIS — R109 Unspecified abdominal pain: Secondary | ICD-10-CM | POA: Diagnosis not present

## 2020-05-17 DIAGNOSIS — H5789 Other specified disorders of eye and adnexa: Secondary | ICD-10-CM | POA: Diagnosis not present

## 2020-05-17 DIAGNOSIS — Z79899 Other long term (current) drug therapy: Secondary | ICD-10-CM | POA: Diagnosis not present

## 2020-05-17 DIAGNOSIS — R5383 Other fatigue: Secondary | ICD-10-CM | POA: Diagnosis not present

## 2020-05-17 DIAGNOSIS — Z7952 Long term (current) use of systemic steroids: Secondary | ICD-10-CM | POA: Diagnosis not present

## 2020-05-17 DIAGNOSIS — C9201 Acute myeloblastic leukemia, in remission: Secondary | ICD-10-CM | POA: Diagnosis not present

## 2020-05-17 DIAGNOSIS — Z9481 Bone marrow transplant status: Secondary | ICD-10-CM | POA: Diagnosis not present

## 2020-05-17 DIAGNOSIS — R112 Nausea with vomiting, unspecified: Secondary | ICD-10-CM | POA: Diagnosis not present

## 2020-05-20 DIAGNOSIS — R682 Dry mouth, unspecified: Secondary | ICD-10-CM | POA: Diagnosis not present

## 2020-05-20 DIAGNOSIS — R5383 Other fatigue: Secondary | ICD-10-CM | POA: Diagnosis not present

## 2020-05-20 DIAGNOSIS — H5789 Other specified disorders of eye and adnexa: Secondary | ICD-10-CM | POA: Diagnosis not present

## 2020-05-20 DIAGNOSIS — R109 Unspecified abdominal pain: Secondary | ICD-10-CM | POA: Diagnosis not present

## 2020-05-20 DIAGNOSIS — C9201 Acute myeloblastic leukemia, in remission: Secondary | ICD-10-CM | POA: Diagnosis not present

## 2020-05-20 DIAGNOSIS — L539 Erythematous condition, unspecified: Secondary | ICD-10-CM | POA: Diagnosis not present

## 2020-05-20 DIAGNOSIS — R110 Nausea: Secondary | ICD-10-CM | POA: Diagnosis not present

## 2020-05-20 DIAGNOSIS — Z9481 Bone marrow transplant status: Secondary | ICD-10-CM | POA: Diagnosis not present

## 2020-05-20 DIAGNOSIS — Z79899 Other long term (current) drug therapy: Secondary | ICD-10-CM | POA: Diagnosis not present

## 2020-05-23 DIAGNOSIS — C9201 Acute myeloblastic leukemia, in remission: Secondary | ICD-10-CM | POA: Diagnosis not present

## 2020-05-23 DIAGNOSIS — Z9481 Bone marrow transplant status: Secondary | ICD-10-CM | POA: Diagnosis not present

## 2020-05-24 DIAGNOSIS — Z9481 Bone marrow transplant status: Secondary | ICD-10-CM | POA: Diagnosis not present

## 2020-05-24 DIAGNOSIS — R21 Rash and other nonspecific skin eruption: Secondary | ICD-10-CM | POA: Diagnosis not present

## 2020-05-24 DIAGNOSIS — D61818 Other pancytopenia: Secondary | ICD-10-CM | POA: Diagnosis not present

## 2020-05-24 DIAGNOSIS — R109 Unspecified abdominal pain: Secondary | ICD-10-CM | POA: Diagnosis not present

## 2020-05-24 DIAGNOSIS — C9201 Acute myeloblastic leukemia, in remission: Secondary | ICD-10-CM | POA: Diagnosis not present

## 2020-05-24 DIAGNOSIS — L539 Erythematous condition, unspecified: Secondary | ICD-10-CM | POA: Diagnosis not present

## 2020-05-24 DIAGNOSIS — R110 Nausea: Secondary | ICD-10-CM | POA: Diagnosis not present

## 2020-05-24 DIAGNOSIS — Z79899 Other long term (current) drug therapy: Secondary | ICD-10-CM | POA: Diagnosis not present

## 2020-05-27 DIAGNOSIS — R110 Nausea: Secondary | ICD-10-CM | POA: Diagnosis not present

## 2020-05-27 DIAGNOSIS — Z79899 Other long term (current) drug therapy: Secondary | ICD-10-CM | POA: Diagnosis not present

## 2020-05-27 DIAGNOSIS — C9201 Acute myeloblastic leukemia, in remission: Secondary | ICD-10-CM | POA: Diagnosis not present

## 2020-05-27 DIAGNOSIS — Z9481 Bone marrow transplant status: Secondary | ICD-10-CM | POA: Diagnosis not present

## 2020-05-27 DIAGNOSIS — Z792 Long term (current) use of antibiotics: Secondary | ICD-10-CM | POA: Diagnosis not present

## 2020-05-31 DIAGNOSIS — Z79899 Other long term (current) drug therapy: Secondary | ICD-10-CM | POA: Diagnosis not present

## 2020-05-31 DIAGNOSIS — D7589 Other specified diseases of blood and blood-forming organs: Secondary | ICD-10-CM | POA: Diagnosis not present

## 2020-05-31 DIAGNOSIS — K30 Functional dyspepsia: Secondary | ICD-10-CM | POA: Diagnosis not present

## 2020-05-31 DIAGNOSIS — C9201 Acute myeloblastic leukemia, in remission: Secondary | ICD-10-CM | POA: Diagnosis not present

## 2020-05-31 DIAGNOSIS — Z792 Long term (current) use of antibiotics: Secondary | ICD-10-CM | POA: Diagnosis not present

## 2020-05-31 DIAGNOSIS — Z9481 Bone marrow transplant status: Secondary | ICD-10-CM | POA: Diagnosis not present

## 2020-05-31 DIAGNOSIS — Z7952 Long term (current) use of systemic steroids: Secondary | ICD-10-CM | POA: Diagnosis not present

## 2020-05-31 DIAGNOSIS — R110 Nausea: Secondary | ICD-10-CM | POA: Diagnosis not present

## 2020-06-03 DIAGNOSIS — Z9484 Stem cells transplant status: Secondary | ICD-10-CM | POA: Diagnosis not present

## 2020-06-03 DIAGNOSIS — R110 Nausea: Secondary | ICD-10-CM | POA: Diagnosis not present

## 2020-06-03 DIAGNOSIS — Z9481 Bone marrow transplant status: Secondary | ICD-10-CM | POA: Diagnosis not present

## 2020-06-03 DIAGNOSIS — C9201 Acute myeloblastic leukemia, in remission: Secondary | ICD-10-CM | POA: Diagnosis not present

## 2020-06-03 DIAGNOSIS — Z79899 Other long term (current) drug therapy: Secondary | ICD-10-CM | POA: Diagnosis not present

## 2020-06-07 DIAGNOSIS — Z9481 Bone marrow transplant status: Secondary | ICD-10-CM | POA: Diagnosis not present

## 2020-06-07 DIAGNOSIS — C9201 Acute myeloblastic leukemia, in remission: Secondary | ICD-10-CM | POA: Diagnosis not present

## 2020-06-07 DIAGNOSIS — N179 Acute kidney failure, unspecified: Secondary | ICD-10-CM | POA: Diagnosis not present

## 2020-06-07 DIAGNOSIS — R110 Nausea: Secondary | ICD-10-CM | POA: Diagnosis not present

## 2020-06-07 DIAGNOSIS — Z79899 Other long term (current) drug therapy: Secondary | ICD-10-CM | POA: Diagnosis not present

## 2020-06-07 DIAGNOSIS — Z9484 Stem cells transplant status: Secondary | ICD-10-CM | POA: Diagnosis not present

## 2020-06-07 DIAGNOSIS — D7589 Other specified diseases of blood and blood-forming organs: Secondary | ICD-10-CM | POA: Diagnosis not present

## 2020-06-10 DIAGNOSIS — Z9481 Bone marrow transplant status: Secondary | ICD-10-CM | POA: Diagnosis not present

## 2020-06-10 DIAGNOSIS — D89813 Graft-versus-host disease, unspecified: Secondary | ICD-10-CM | POA: Diagnosis not present

## 2020-06-10 DIAGNOSIS — C9201 Acute myeloblastic leukemia, in remission: Secondary | ICD-10-CM | POA: Diagnosis not present

## 2020-06-14 DIAGNOSIS — Z9481 Bone marrow transplant status: Secondary | ICD-10-CM | POA: Diagnosis not present

## 2020-06-14 DIAGNOSIS — C9201 Acute myeloblastic leukemia, in remission: Secondary | ICD-10-CM | POA: Diagnosis not present

## 2020-06-21 DIAGNOSIS — Z792 Long term (current) use of antibiotics: Secondary | ICD-10-CM | POA: Diagnosis not present

## 2020-06-21 DIAGNOSIS — Z79899 Other long term (current) drug therapy: Secondary | ICD-10-CM | POA: Diagnosis not present

## 2020-06-21 DIAGNOSIS — R42 Dizziness and giddiness: Secondary | ICD-10-CM | POA: Diagnosis not present

## 2020-06-21 DIAGNOSIS — C9201 Acute myeloblastic leukemia, in remission: Secondary | ICD-10-CM | POA: Diagnosis not present

## 2020-06-21 DIAGNOSIS — R110 Nausea: Secondary | ICD-10-CM | POA: Diagnosis not present

## 2020-06-21 DIAGNOSIS — C9200 Acute myeloblastic leukemia, not having achieved remission: Secondary | ICD-10-CM | POA: Diagnosis not present

## 2020-06-21 DIAGNOSIS — R21 Rash and other nonspecific skin eruption: Secondary | ICD-10-CM | POA: Diagnosis not present

## 2020-06-21 DIAGNOSIS — Z9481 Bone marrow transplant status: Secondary | ICD-10-CM | POA: Diagnosis not present

## 2020-06-21 DIAGNOSIS — R109 Unspecified abdominal pain: Secondary | ICD-10-CM | POA: Diagnosis not present

## 2020-06-28 DIAGNOSIS — K219 Gastro-esophageal reflux disease without esophagitis: Secondary | ICD-10-CM | POA: Diagnosis not present

## 2020-06-28 DIAGNOSIS — Z79899 Other long term (current) drug therapy: Secondary | ICD-10-CM | POA: Diagnosis not present

## 2020-06-28 DIAGNOSIS — Z9481 Bone marrow transplant status: Secondary | ICD-10-CM | POA: Diagnosis not present

## 2020-06-28 DIAGNOSIS — C9201 Acute myeloblastic leukemia, in remission: Secondary | ICD-10-CM | POA: Diagnosis not present

## 2020-06-29 DIAGNOSIS — C9201 Acute myeloblastic leukemia, in remission: Secondary | ICD-10-CM | POA: Diagnosis not present

## 2020-06-29 DIAGNOSIS — Z9481 Bone marrow transplant status: Secondary | ICD-10-CM | POA: Diagnosis not present

## 2020-06-29 DIAGNOSIS — Z23 Encounter for immunization: Secondary | ICD-10-CM | POA: Diagnosis not present

## 2020-06-30 DIAGNOSIS — Z9481 Bone marrow transplant status: Secondary | ICD-10-CM | POA: Diagnosis not present

## 2020-06-30 DIAGNOSIS — C9201 Acute myeloblastic leukemia, in remission: Secondary | ICD-10-CM | POA: Diagnosis not present

## 2020-07-05 DIAGNOSIS — C9201 Acute myeloblastic leukemia, in remission: Secondary | ICD-10-CM | POA: Diagnosis not present

## 2020-07-05 DIAGNOSIS — Z79899 Other long term (current) drug therapy: Secondary | ICD-10-CM | POA: Diagnosis not present

## 2020-07-05 DIAGNOSIS — Z9481 Bone marrow transplant status: Secondary | ICD-10-CM | POA: Diagnosis not present

## 2020-07-05 DIAGNOSIS — R002 Palpitations: Secondary | ICD-10-CM | POA: Diagnosis not present

## 2020-07-12 DIAGNOSIS — C9201 Acute myeloblastic leukemia, in remission: Secondary | ICD-10-CM | POA: Diagnosis not present

## 2020-07-12 DIAGNOSIS — Z9481 Bone marrow transplant status: Secondary | ICD-10-CM | POA: Diagnosis not present

## 2020-07-12 DIAGNOSIS — R002 Palpitations: Secondary | ICD-10-CM | POA: Diagnosis not present

## 2020-07-12 DIAGNOSIS — R21 Rash and other nonspecific skin eruption: Secondary | ICD-10-CM | POA: Diagnosis not present

## 2020-07-12 DIAGNOSIS — B370 Candidal stomatitis: Secondary | ICD-10-CM | POA: Diagnosis not present

## 2020-07-19 DIAGNOSIS — C9201 Acute myeloblastic leukemia, in remission: Secondary | ICD-10-CM | POA: Diagnosis not present

## 2020-07-19 DIAGNOSIS — Z5181 Encounter for therapeutic drug level monitoring: Secondary | ICD-10-CM | POA: Diagnosis not present

## 2020-07-19 DIAGNOSIS — Z9481 Bone marrow transplant status: Secondary | ICD-10-CM | POA: Diagnosis not present

## 2020-07-19 DIAGNOSIS — Z79899 Other long term (current) drug therapy: Secondary | ICD-10-CM | POA: Diagnosis not present

## 2020-07-20 DIAGNOSIS — Z23 Encounter for immunization: Secondary | ICD-10-CM | POA: Diagnosis not present

## 2020-07-20 IMAGING — CT CT CARDIAC CORONARY ARTERY CALCIUM SCORE
3 series · 14 of 20 positions shown, 15 images · non-contrast
Comparison: 05/07/2018 chest CT.
COMPARISON: 05/07/2018 chest CT.

Addendum:
EXAM:
OVER-READ INTERPRETATION  CT CHEST

The following report is an over-read performed by radiologist Dr.
Prepac Bvb [REDACTED] on 05/27/2019. This over-read
does not include interpretation of cardiac or coronary anatomy or
pathology. The calcium score interpretation by the cardiologist is
attached.
CLINICAL DATA: 68M for risk stratification
Coronary Calcium Score
TECHNIQUE: The patient was scanned on a Siemens Force scanner. Axial
non-contrast 3 mm slices were carried out through the heart. The
data set was analyzed on a dedicated work station and scored using
the Agatson method.

[Series 2: casc 3.0 bv41 2 bestdiast 71 % · axial · 0.39mm/px · z∈[-249,-159]mm · 4 of 52 slices shown, 5 images]
[im 11/52  vessel]
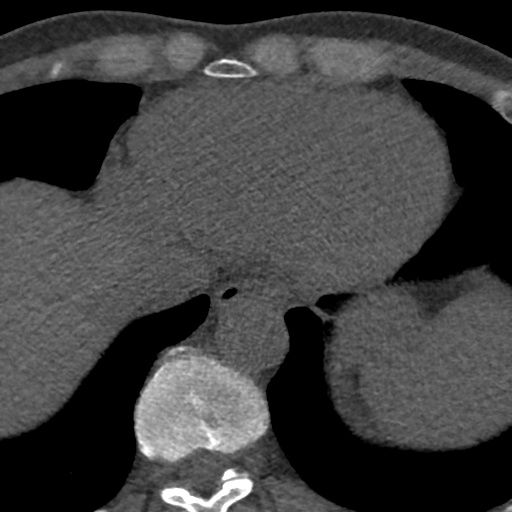
[im 11/52  lung]
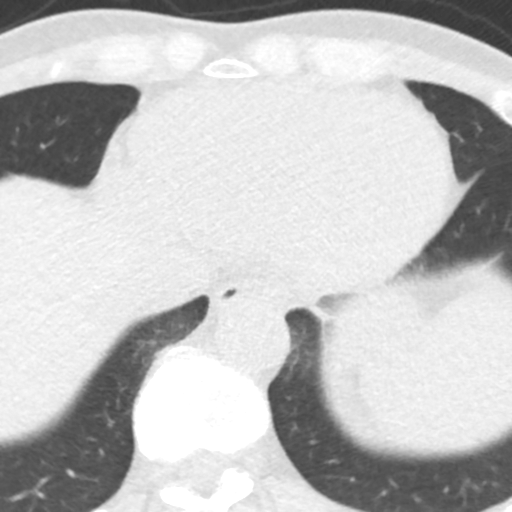
[im 21/52  vessel]
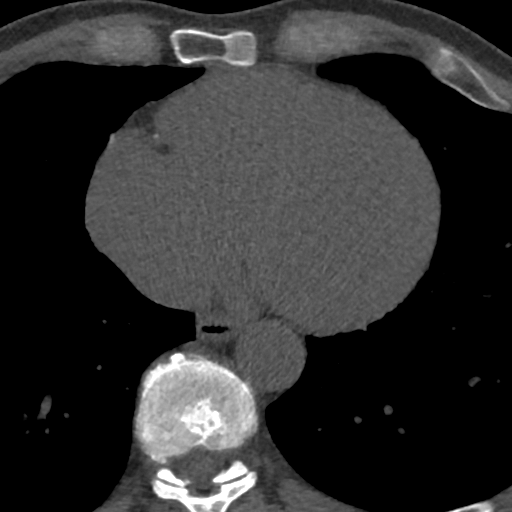
[im 31/52  vessel]
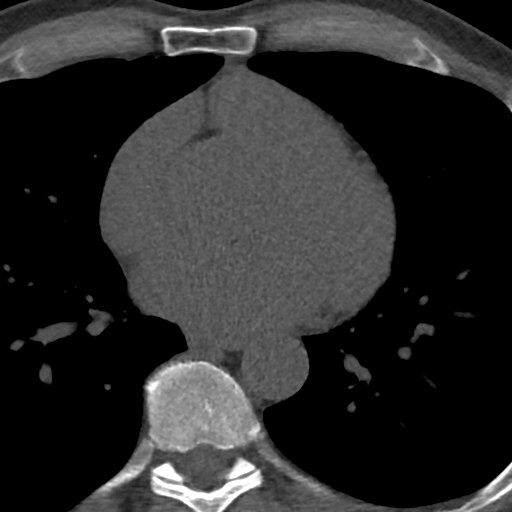
[im 41/52  vessel]
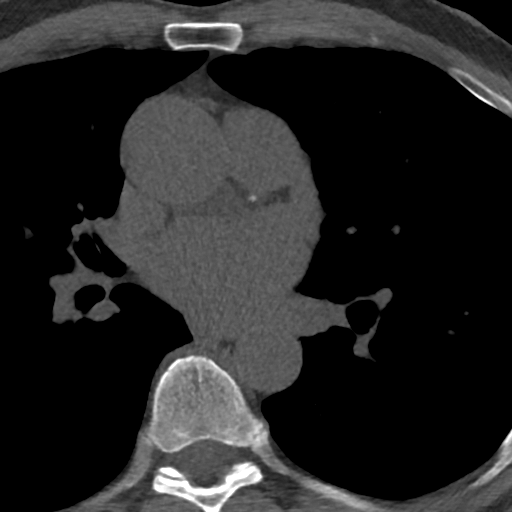

[Series 3: lung 68 % · axial · 0.68mm/px · z∈[-255,-153]mm · 5 of 52 slices shown]
[im 9/52  lung]
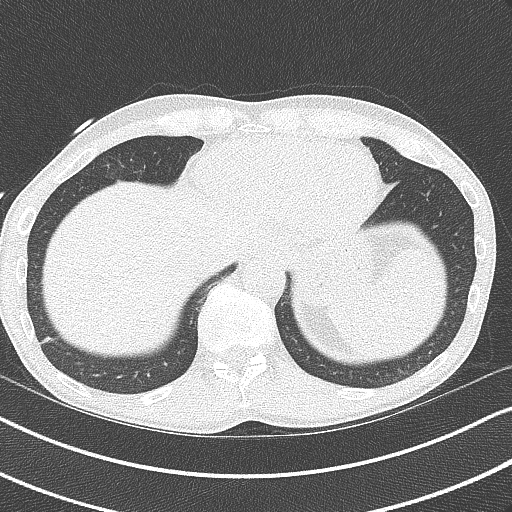
[im 18/52  lung]
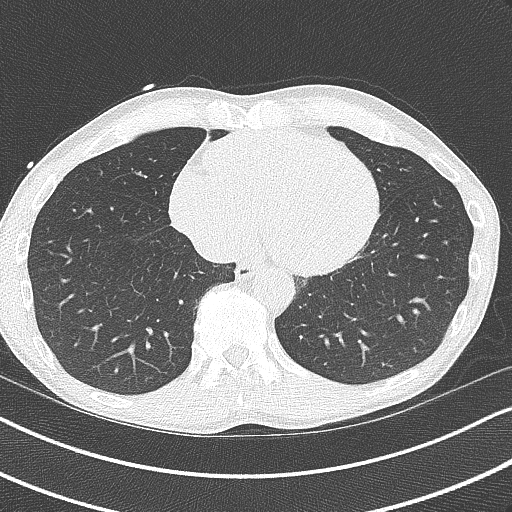
[im 26/52  lung]
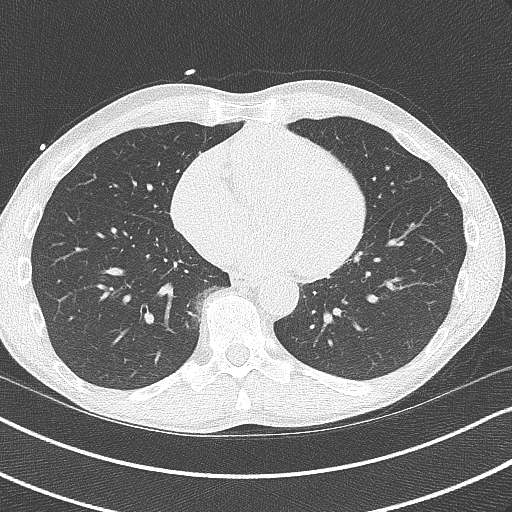
[im 35/52  lung]
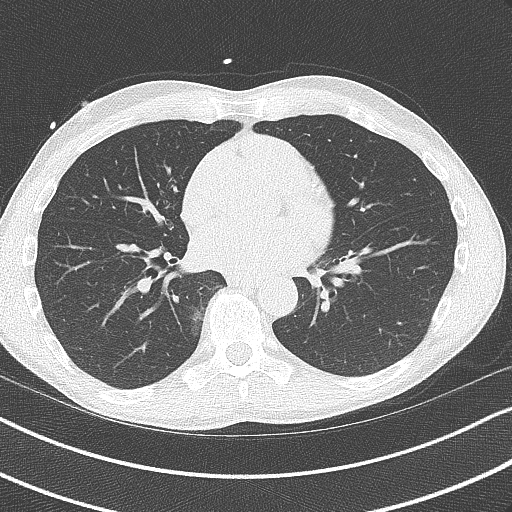
[im 43/52  lung]
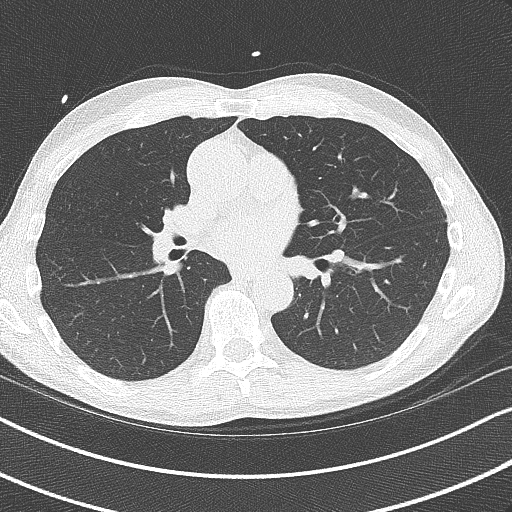

[Series 4: lung st 68 % · axial · 0.68mm/px · z∈[-255,-153]mm · 5 of 52 slices shown]
[im 9/52  lung]
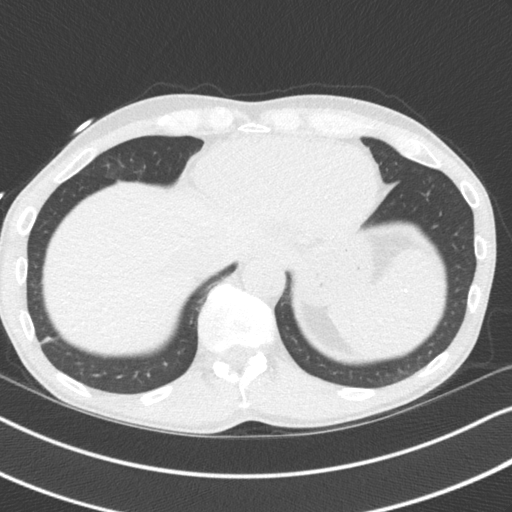
[im 18/52  lung]
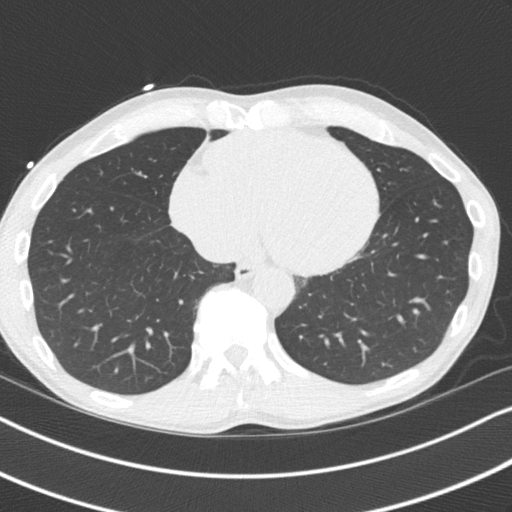
[im 26/52  lung]
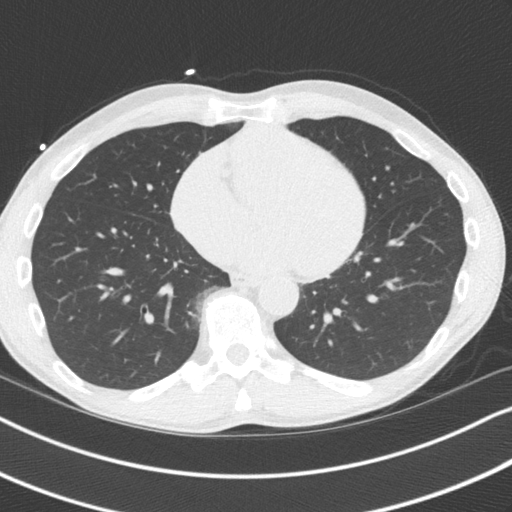
[im 35/52  lung]
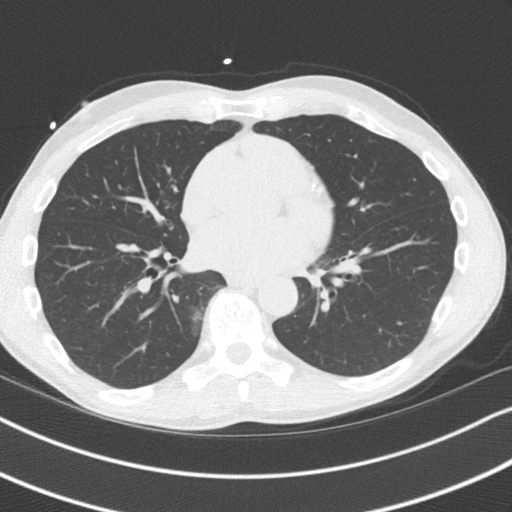
[im 43/52  lung]
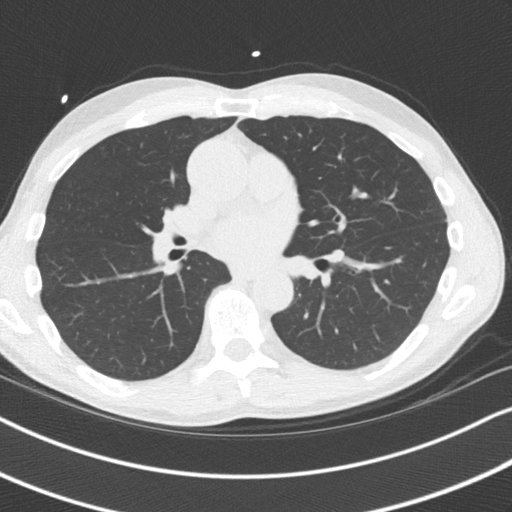

[14 of 20 positions shown; findings below may reference images not displayed]

FINDINGS: Vascular: Normal aortic caliber.  Tortuous thoracic aorta.

Mediastinum/Nodes: No mediastinal adenopathy. A calcified right
hilar node is likely related to old granulomatous disease.

Lungs/Pleura: No pleural fluid. 4 mm left upper lobe pulmonary
nodule on [DATE] is similar to 05/07/2018 and can be presumed benign.

Upper Abdomen: Normal imaged portions of the liver, stomach, left
kidney. Old granulomatous disease in the spleen.

Musculoskeletal: No acute osseous abnormality.
IMPRESSION: 1.  No acute findings in the imaged extracardiac chest.
2. Stable 4 mm left upper lobe pulmonary nodule, benign.
FINDINGS: Non-cardiac: See separate report from [REDACTED].

Ascending Aorta: Mild ascending aorta aneurysm.  3.8 cm.

Pericardium: Normal

Coronary arteries: Normal anatomy. Right dominant. Calcifications
noted in the LAD, LCX and RCA territories.
IMPRESSION: Coronary calcium score of 300. This was 72nd percentile for age and
sex matched control.

Recommend aggressive risk factor modification including high potency
statin.

*** End of Addendum ***
EXAM:
OVER-READ INTERPRETATION  CT CHEST

The following report is an over-read performed by radiologist Dr.
Prepac Bvb [REDACTED] on 05/27/2019. This over-read
does not include interpretation of cardiac or coronary anatomy or
pathology. The calcium score interpretation by the cardiologist is
attached.
FINDINGS: Vascular: Normal aortic caliber.  Tortuous thoracic aorta.

Mediastinum/Nodes: No mediastinal adenopathy. A calcified right
hilar node is likely related to old granulomatous disease.

Lungs/Pleura: No pleural fluid. 4 mm left upper lobe pulmonary
nodule on [DATE] is similar to 05/07/2018 and can be presumed benign.

Upper Abdomen: Normal imaged portions of the liver, stomach, left
kidney. Old granulomatous disease in the spleen.

Musculoskeletal: No acute osseous abnormality.
IMPRESSION: 1.  No acute findings in the imaged extracardiac chest.
2. Stable 4 mm left upper lobe pulmonary nodule, benign.

## 2020-07-21 DIAGNOSIS — Z79899 Other long term (current) drug therapy: Secondary | ICD-10-CM | POA: Diagnosis not present

## 2020-07-21 DIAGNOSIS — Z5181 Encounter for therapeutic drug level monitoring: Secondary | ICD-10-CM | POA: Diagnosis not present

## 2020-07-26 DIAGNOSIS — Z9481 Bone marrow transplant status: Secondary | ICD-10-CM | POA: Diagnosis not present

## 2020-07-26 DIAGNOSIS — C9201 Acute myeloblastic leukemia, in remission: Secondary | ICD-10-CM | POA: Diagnosis not present

## 2020-08-02 DIAGNOSIS — Z79899 Other long term (current) drug therapy: Secondary | ICD-10-CM | POA: Diagnosis not present

## 2020-08-02 DIAGNOSIS — C9201 Acute myeloblastic leukemia, in remission: Secondary | ICD-10-CM | POA: Diagnosis not present

## 2020-08-02 DIAGNOSIS — Z9481 Bone marrow transplant status: Secondary | ICD-10-CM | POA: Diagnosis not present

## 2020-08-08 DIAGNOSIS — Z9481 Bone marrow transplant status: Secondary | ICD-10-CM | POA: Diagnosis not present

## 2020-08-08 DIAGNOSIS — C9201 Acute myeloblastic leukemia, in remission: Secondary | ICD-10-CM | POA: Diagnosis not present

## 2020-08-16 DIAGNOSIS — Z9481 Bone marrow transplant status: Secondary | ICD-10-CM | POA: Diagnosis not present

## 2020-08-16 DIAGNOSIS — N179 Acute kidney failure, unspecified: Secondary | ICD-10-CM | POA: Diagnosis not present

## 2020-08-16 DIAGNOSIS — C9201 Acute myeloblastic leukemia, in remission: Secondary | ICD-10-CM | POA: Diagnosis not present

## 2020-08-18 DIAGNOSIS — Z23 Encounter for immunization: Secondary | ICD-10-CM | POA: Diagnosis not present

## 2020-08-30 DIAGNOSIS — C9201 Acute myeloblastic leukemia, in remission: Secondary | ICD-10-CM | POA: Diagnosis not present

## 2020-08-30 DIAGNOSIS — Z9481 Bone marrow transplant status: Secondary | ICD-10-CM | POA: Diagnosis not present

## 2020-09-01 DIAGNOSIS — C9201 Acute myeloblastic leukemia, in remission: Secondary | ICD-10-CM | POA: Diagnosis not present

## 2020-09-01 DIAGNOSIS — Z9481 Bone marrow transplant status: Secondary | ICD-10-CM | POA: Diagnosis not present

## 2020-09-03 DIAGNOSIS — C9201 Acute myeloblastic leukemia, in remission: Secondary | ICD-10-CM | POA: Diagnosis not present

## 2020-09-03 DIAGNOSIS — Z9481 Bone marrow transplant status: Secondary | ICD-10-CM | POA: Diagnosis not present

## 2020-09-06 DIAGNOSIS — D709 Neutropenia, unspecified: Secondary | ICD-10-CM | POA: Diagnosis not present

## 2020-09-06 DIAGNOSIS — R7989 Other specified abnormal findings of blood chemistry: Secondary | ICD-10-CM | POA: Diagnosis not present

## 2020-09-06 DIAGNOSIS — C9202 Acute myeloblastic leukemia, in relapse: Secondary | ICD-10-CM | POA: Diagnosis not present

## 2020-09-06 DIAGNOSIS — Z9484 Stem cells transplant status: Secondary | ICD-10-CM | POA: Diagnosis not present

## 2020-09-06 DIAGNOSIS — C9201 Acute myeloblastic leukemia, in remission: Secondary | ICD-10-CM | POA: Diagnosis not present

## 2020-09-06 DIAGNOSIS — Z9481 Bone marrow transplant status: Secondary | ICD-10-CM | POA: Diagnosis not present

## 2020-09-07 DIAGNOSIS — C9202 Acute myeloblastic leukemia, in relapse: Secondary | ICD-10-CM | POA: Diagnosis not present

## 2020-09-07 DIAGNOSIS — R002 Palpitations: Secondary | ICD-10-CM | POA: Diagnosis not present

## 2020-09-08 DIAGNOSIS — C9201 Acute myeloblastic leukemia, in remission: Secondary | ICD-10-CM | POA: Diagnosis not present

## 2020-09-08 DIAGNOSIS — Z9481 Bone marrow transplant status: Secondary | ICD-10-CM | POA: Diagnosis not present

## 2020-09-08 DIAGNOSIS — C9202 Acute myeloblastic leukemia, in relapse: Secondary | ICD-10-CM | POA: Diagnosis not present

## 2020-09-13 DIAGNOSIS — R7989 Other specified abnormal findings of blood chemistry: Secondary | ICD-10-CM | POA: Diagnosis not present

## 2020-09-13 DIAGNOSIS — Z9481 Bone marrow transplant status: Secondary | ICD-10-CM | POA: Diagnosis not present

## 2020-09-13 DIAGNOSIS — Z79899 Other long term (current) drug therapy: Secondary | ICD-10-CM | POA: Diagnosis not present

## 2020-09-13 DIAGNOSIS — D709 Neutropenia, unspecified: Secondary | ICD-10-CM | POA: Diagnosis not present

## 2020-09-13 DIAGNOSIS — I071 Rheumatic tricuspid insufficiency: Secondary | ICD-10-CM | POA: Diagnosis not present

## 2020-09-13 DIAGNOSIS — C9202 Acute myeloblastic leukemia, in relapse: Secondary | ICD-10-CM | POA: Diagnosis not present

## 2020-09-13 DIAGNOSIS — Z792 Long term (current) use of antibiotics: Secondary | ICD-10-CM | POA: Diagnosis not present

## 2020-09-13 DIAGNOSIS — J90 Pleural effusion, not elsewhere classified: Secondary | ICD-10-CM | POA: Diagnosis not present

## 2020-09-13 DIAGNOSIS — Z8619 Personal history of other infectious and parasitic diseases: Secondary | ICD-10-CM | POA: Diagnosis not present

## 2020-09-13 DIAGNOSIS — C9201 Acute myeloblastic leukemia, in remission: Secondary | ICD-10-CM | POA: Diagnosis not present

## 2020-09-16 DIAGNOSIS — C9201 Acute myeloblastic leukemia, in remission: Secondary | ICD-10-CM | POA: Diagnosis not present

## 2020-09-16 DIAGNOSIS — Z9481 Bone marrow transplant status: Secondary | ICD-10-CM | POA: Diagnosis not present

## 2020-09-20 DIAGNOSIS — C9202 Acute myeloblastic leukemia, in relapse: Secondary | ICD-10-CM | POA: Diagnosis not present

## 2020-09-20 DIAGNOSIS — D61818 Other pancytopenia: Secondary | ICD-10-CM | POA: Diagnosis not present

## 2020-09-20 DIAGNOSIS — Z79899 Other long term (current) drug therapy: Secondary | ICD-10-CM | POA: Diagnosis not present

## 2020-09-20 DIAGNOSIS — Z9481 Bone marrow transplant status: Secondary | ICD-10-CM | POA: Diagnosis not present

## 2020-09-20 DIAGNOSIS — Z9221 Personal history of antineoplastic chemotherapy: Secondary | ICD-10-CM | POA: Diagnosis not present

## 2020-09-20 DIAGNOSIS — R911 Solitary pulmonary nodule: Secondary | ICD-10-CM | POA: Diagnosis not present

## 2020-09-21 DIAGNOSIS — C9200 Acute myeloblastic leukemia, not having achieved remission: Secondary | ICD-10-CM | POA: Diagnosis not present

## 2020-09-27 DIAGNOSIS — Z79899 Other long term (current) drug therapy: Secondary | ICD-10-CM | POA: Diagnosis not present

## 2020-09-27 DIAGNOSIS — C9201 Acute myeloblastic leukemia, in remission: Secondary | ICD-10-CM | POA: Diagnosis not present

## 2020-09-27 DIAGNOSIS — Z9481 Bone marrow transplant status: Secondary | ICD-10-CM | POA: Diagnosis not present

## 2020-09-27 DIAGNOSIS — C9202 Acute myeloblastic leukemia, in relapse: Secondary | ICD-10-CM | POA: Diagnosis not present

## 2020-09-29 DIAGNOSIS — C9200 Acute myeloblastic leukemia, not having achieved remission: Secondary | ICD-10-CM | POA: Diagnosis not present

## 2020-10-04 DIAGNOSIS — C9202 Acute myeloblastic leukemia, in relapse: Secondary | ICD-10-CM | POA: Diagnosis not present

## 2020-10-04 DIAGNOSIS — Z9481 Bone marrow transplant status: Secondary | ICD-10-CM | POA: Diagnosis not present

## 2020-10-04 DIAGNOSIS — Z79899 Other long term (current) drug therapy: Secondary | ICD-10-CM | POA: Diagnosis not present

## 2020-10-04 DIAGNOSIS — C9201 Acute myeloblastic leukemia, in remission: Secondary | ICD-10-CM | POA: Diagnosis not present

## 2020-10-11 DIAGNOSIS — C9202 Acute myeloblastic leukemia, in relapse: Secondary | ICD-10-CM | POA: Diagnosis not present

## 2020-10-11 DIAGNOSIS — Z9481 Bone marrow transplant status: Secondary | ICD-10-CM | POA: Diagnosis not present

## 2020-10-18 DIAGNOSIS — Z9484 Stem cells transplant status: Secondary | ICD-10-CM | POA: Diagnosis not present

## 2020-10-18 DIAGNOSIS — R945 Abnormal results of liver function studies: Secondary | ICD-10-CM | POA: Diagnosis not present

## 2020-10-18 DIAGNOSIS — Z9481 Bone marrow transplant status: Secondary | ICD-10-CM | POA: Diagnosis not present

## 2020-10-18 DIAGNOSIS — C9202 Acute myeloblastic leukemia, in relapse: Secondary | ICD-10-CM | POA: Diagnosis not present

## 2020-10-20 DIAGNOSIS — Z23 Encounter for immunization: Secondary | ICD-10-CM | POA: Diagnosis not present

## 2020-10-21 DIAGNOSIS — C9202 Acute myeloblastic leukemia, in relapse: Secondary | ICD-10-CM | POA: Diagnosis not present

## 2020-10-21 DIAGNOSIS — Z9481 Bone marrow transplant status: Secondary | ICD-10-CM | POA: Diagnosis not present

## 2020-10-25 DIAGNOSIS — C9202 Acute myeloblastic leukemia, in relapse: Secondary | ICD-10-CM | POA: Diagnosis not present

## 2020-10-25 DIAGNOSIS — R7989 Other specified abnormal findings of blood chemistry: Secondary | ICD-10-CM | POA: Diagnosis not present

## 2020-10-25 DIAGNOSIS — Z9481 Bone marrow transplant status: Secondary | ICD-10-CM | POA: Diagnosis not present

## 2020-10-25 DIAGNOSIS — Z9484 Stem cells transplant status: Secondary | ICD-10-CM | POA: Diagnosis not present

## 2020-10-25 DIAGNOSIS — Z79899 Other long term (current) drug therapy: Secondary | ICD-10-CM | POA: Diagnosis not present

## 2020-10-25 DIAGNOSIS — Z792 Long term (current) use of antibiotics: Secondary | ICD-10-CM | POA: Diagnosis not present

## 2020-10-28 DIAGNOSIS — C9201 Acute myeloblastic leukemia, in remission: Secondary | ICD-10-CM | POA: Diagnosis not present

## 2020-10-28 DIAGNOSIS — C9202 Acute myeloblastic leukemia, in relapse: Secondary | ICD-10-CM | POA: Diagnosis not present

## 2020-10-28 DIAGNOSIS — Z9481 Bone marrow transplant status: Secondary | ICD-10-CM | POA: Diagnosis not present

## 2020-10-31 DIAGNOSIS — C9202 Acute myeloblastic leukemia, in relapse: Secondary | ICD-10-CM | POA: Diagnosis not present

## 2020-11-01 DIAGNOSIS — C9202 Acute myeloblastic leukemia, in relapse: Secondary | ICD-10-CM | POA: Diagnosis not present

## 2020-11-01 DIAGNOSIS — Z9481 Bone marrow transplant status: Secondary | ICD-10-CM | POA: Diagnosis not present

## 2020-11-01 DIAGNOSIS — Z9221 Personal history of antineoplastic chemotherapy: Secondary | ICD-10-CM | POA: Diagnosis not present

## 2020-11-01 DIAGNOSIS — R7989 Other specified abnormal findings of blood chemistry: Secondary | ICD-10-CM | POA: Diagnosis not present

## 2020-11-01 DIAGNOSIS — Z79899 Other long term (current) drug therapy: Secondary | ICD-10-CM | POA: Diagnosis not present

## 2020-11-02 DIAGNOSIS — C9202 Acute myeloblastic leukemia, in relapse: Secondary | ICD-10-CM | POA: Diagnosis not present

## 2020-11-02 DIAGNOSIS — R0789 Other chest pain: Secondary | ICD-10-CM | POA: Diagnosis not present

## 2020-11-02 DIAGNOSIS — Z9481 Bone marrow transplant status: Secondary | ICD-10-CM | POA: Diagnosis not present

## 2020-11-04 DIAGNOSIS — C9202 Acute myeloblastic leukemia, in relapse: Secondary | ICD-10-CM | POA: Diagnosis not present

## 2020-11-04 DIAGNOSIS — Z9481 Bone marrow transplant status: Secondary | ICD-10-CM | POA: Diagnosis not present

## 2020-11-04 DIAGNOSIS — Z23 Encounter for immunization: Secondary | ICD-10-CM | POA: Diagnosis not present

## 2020-11-08 DIAGNOSIS — Z9481 Bone marrow transplant status: Secondary | ICD-10-CM | POA: Diagnosis not present

## 2020-11-08 DIAGNOSIS — C9202 Acute myeloblastic leukemia, in relapse: Secondary | ICD-10-CM | POA: Diagnosis not present

## 2020-11-08 DIAGNOSIS — B1910 Unspecified viral hepatitis B without hepatic coma: Secondary | ICD-10-CM | POA: Diagnosis not present

## 2020-11-11 DIAGNOSIS — Z9481 Bone marrow transplant status: Secondary | ICD-10-CM | POA: Diagnosis not present

## 2020-11-11 DIAGNOSIS — D539 Nutritional anemia, unspecified: Secondary | ICD-10-CM | POA: Diagnosis not present

## 2020-11-11 DIAGNOSIS — D61818 Other pancytopenia: Secondary | ICD-10-CM | POA: Diagnosis not present

## 2020-11-11 DIAGNOSIS — C9202 Acute myeloblastic leukemia, in relapse: Secondary | ICD-10-CM | POA: Diagnosis not present

## 2020-11-14 DIAGNOSIS — C9202 Acute myeloblastic leukemia, in relapse: Secondary | ICD-10-CM | POA: Diagnosis not present

## 2020-11-14 DIAGNOSIS — Z9481 Bone marrow transplant status: Secondary | ICD-10-CM | POA: Diagnosis not present

## 2020-11-14 DIAGNOSIS — C9200 Acute myeloblastic leukemia, not having achieved remission: Secondary | ICD-10-CM | POA: Diagnosis not present

## 2020-11-15 DIAGNOSIS — Z79899 Other long term (current) drug therapy: Secondary | ICD-10-CM | POA: Diagnosis not present

## 2020-11-15 DIAGNOSIS — Z9481 Bone marrow transplant status: Secondary | ICD-10-CM | POA: Diagnosis not present

## 2020-11-15 DIAGNOSIS — Z792 Long term (current) use of antibiotics: Secondary | ICD-10-CM | POA: Diagnosis not present

## 2020-11-15 DIAGNOSIS — C9202 Acute myeloblastic leukemia, in relapse: Secondary | ICD-10-CM | POA: Diagnosis not present

## 2020-11-15 DIAGNOSIS — R7989 Other specified abnormal findings of blood chemistry: Secondary | ICD-10-CM | POA: Diagnosis not present

## 2020-11-15 DIAGNOSIS — Z95828 Presence of other vascular implants and grafts: Secondary | ICD-10-CM | POA: Diagnosis not present

## 2020-11-16 DIAGNOSIS — Z5111 Encounter for antineoplastic chemotherapy: Secondary | ICD-10-CM | POA: Diagnosis not present

## 2020-11-16 DIAGNOSIS — C9202 Acute myeloblastic leukemia, in relapse: Secondary | ICD-10-CM | POA: Diagnosis not present

## 2020-11-17 DIAGNOSIS — Z9481 Bone marrow transplant status: Secondary | ICD-10-CM | POA: Diagnosis not present

## 2020-11-17 DIAGNOSIS — Z5111 Encounter for antineoplastic chemotherapy: Secondary | ICD-10-CM | POA: Diagnosis not present

## 2020-11-17 DIAGNOSIS — C9202 Acute myeloblastic leukemia, in relapse: Secondary | ICD-10-CM | POA: Diagnosis not present

## 2020-11-18 DIAGNOSIS — C9202 Acute myeloblastic leukemia, in relapse: Secondary | ICD-10-CM | POA: Diagnosis not present

## 2020-11-18 DIAGNOSIS — Z5111 Encounter for antineoplastic chemotherapy: Secondary | ICD-10-CM | POA: Diagnosis not present

## 2020-11-18 DIAGNOSIS — Z9481 Bone marrow transplant status: Secondary | ICD-10-CM | POA: Diagnosis not present

## 2020-11-19 DIAGNOSIS — Z9481 Bone marrow transplant status: Secondary | ICD-10-CM | POA: Diagnosis not present

## 2020-11-19 DIAGNOSIS — Z5111 Encounter for antineoplastic chemotherapy: Secondary | ICD-10-CM | POA: Diagnosis not present

## 2020-11-19 DIAGNOSIS — C9202 Acute myeloblastic leukemia, in relapse: Secondary | ICD-10-CM | POA: Diagnosis not present

## 2020-11-22 DIAGNOSIS — Z79899 Other long term (current) drug therapy: Secondary | ICD-10-CM | POA: Diagnosis not present

## 2020-11-22 DIAGNOSIS — R7989 Other specified abnormal findings of blood chemistry: Secondary | ICD-10-CM | POA: Diagnosis not present

## 2020-11-22 DIAGNOSIS — R42 Dizziness and giddiness: Secondary | ICD-10-CM | POA: Diagnosis not present

## 2020-11-22 DIAGNOSIS — C9202 Acute myeloblastic leukemia, in relapse: Secondary | ICD-10-CM | POA: Diagnosis not present

## 2020-11-22 DIAGNOSIS — Z792 Long term (current) use of antibiotics: Secondary | ICD-10-CM | POA: Diagnosis not present

## 2020-11-22 DIAGNOSIS — R5383 Other fatigue: Secondary | ICD-10-CM | POA: Diagnosis not present

## 2020-11-22 DIAGNOSIS — Z9481 Bone marrow transplant status: Secondary | ICD-10-CM | POA: Diagnosis not present

## 2020-11-22 DIAGNOSIS — Z9484 Stem cells transplant status: Secondary | ICD-10-CM | POA: Diagnosis not present

## 2020-11-25 DIAGNOSIS — C9201 Acute myeloblastic leukemia, in remission: Secondary | ICD-10-CM | POA: Diagnosis not present

## 2020-11-29 DIAGNOSIS — Z9484 Stem cells transplant status: Secondary | ICD-10-CM | POA: Diagnosis not present

## 2020-11-29 DIAGNOSIS — Z9481 Bone marrow transplant status: Secondary | ICD-10-CM | POA: Diagnosis not present

## 2020-11-29 DIAGNOSIS — K5900 Constipation, unspecified: Secondary | ICD-10-CM | POA: Diagnosis not present

## 2020-11-29 DIAGNOSIS — R42 Dizziness and giddiness: Secondary | ICD-10-CM | POA: Diagnosis not present

## 2020-11-29 DIAGNOSIS — R519 Headache, unspecified: Secondary | ICD-10-CM | POA: Diagnosis not present

## 2020-11-29 DIAGNOSIS — Z79899 Other long term (current) drug therapy: Secondary | ICD-10-CM | POA: Diagnosis not present

## 2020-11-29 DIAGNOSIS — Z792 Long term (current) use of antibiotics: Secondary | ICD-10-CM | POA: Diagnosis not present

## 2020-11-29 DIAGNOSIS — R110 Nausea: Secondary | ICD-10-CM | POA: Diagnosis not present

## 2020-11-29 DIAGNOSIS — R5383 Other fatigue: Secondary | ICD-10-CM | POA: Diagnosis not present

## 2020-11-29 DIAGNOSIS — C9202 Acute myeloblastic leukemia, in relapse: Secondary | ICD-10-CM | POA: Diagnosis not present

## 2020-12-02 DIAGNOSIS — Z9481 Bone marrow transplant status: Secondary | ICD-10-CM | POA: Diagnosis not present

## 2020-12-02 DIAGNOSIS — C9202 Acute myeloblastic leukemia, in relapse: Secondary | ICD-10-CM | POA: Diagnosis not present

## 2020-12-06 DIAGNOSIS — Z792 Long term (current) use of antibiotics: Secondary | ICD-10-CM | POA: Diagnosis not present

## 2020-12-06 DIAGNOSIS — C9202 Acute myeloblastic leukemia, in relapse: Secondary | ICD-10-CM | POA: Diagnosis not present

## 2020-12-06 DIAGNOSIS — R6881 Early satiety: Secondary | ICD-10-CM | POA: Diagnosis not present

## 2020-12-06 DIAGNOSIS — Z9481 Bone marrow transplant status: Secondary | ICD-10-CM | POA: Diagnosis not present

## 2020-12-06 DIAGNOSIS — Z9484 Stem cells transplant status: Secondary | ICD-10-CM | POA: Diagnosis not present

## 2020-12-06 DIAGNOSIS — R110 Nausea: Secondary | ICD-10-CM | POA: Diagnosis not present

## 2020-12-06 DIAGNOSIS — Z79899 Other long term (current) drug therapy: Secondary | ICD-10-CM | POA: Diagnosis not present

## 2020-12-06 DIAGNOSIS — D709 Neutropenia, unspecified: Secondary | ICD-10-CM | POA: Diagnosis not present

## 2020-12-06 DIAGNOSIS — R5383 Other fatigue: Secondary | ICD-10-CM | POA: Diagnosis not present

## 2020-12-06 DIAGNOSIS — R42 Dizziness and giddiness: Secondary | ICD-10-CM | POA: Diagnosis not present

## 2020-12-06 DIAGNOSIS — K068 Other specified disorders of gingiva and edentulous alveolar ridge: Secondary | ICD-10-CM | POA: Diagnosis not present

## 2020-12-06 DIAGNOSIS — R7401 Elevation of levels of liver transaminase levels: Secondary | ICD-10-CM | POA: Diagnosis not present

## 2020-12-09 DIAGNOSIS — C9202 Acute myeloblastic leukemia, in relapse: Secondary | ICD-10-CM | POA: Diagnosis not present

## 2020-12-09 DIAGNOSIS — Z9481 Bone marrow transplant status: Secondary | ICD-10-CM | POA: Diagnosis not present

## 2020-12-09 DIAGNOSIS — C9201 Acute myeloblastic leukemia, in remission: Secondary | ICD-10-CM | POA: Diagnosis not present

## 2020-12-13 DIAGNOSIS — Z9484 Stem cells transplant status: Secondary | ICD-10-CM | POA: Diagnosis not present

## 2020-12-13 DIAGNOSIS — R7989 Other specified abnormal findings of blood chemistry: Secondary | ICD-10-CM | POA: Diagnosis not present

## 2020-12-13 DIAGNOSIS — Z9481 Bone marrow transplant status: Secondary | ICD-10-CM | POA: Diagnosis not present

## 2020-12-13 DIAGNOSIS — R5383 Other fatigue: Secondary | ICD-10-CM | POA: Diagnosis not present

## 2020-12-13 DIAGNOSIS — Z79899 Other long term (current) drug therapy: Secondary | ICD-10-CM | POA: Diagnosis not present

## 2020-12-13 DIAGNOSIS — Z792 Long term (current) use of antibiotics: Secondary | ICD-10-CM | POA: Diagnosis not present

## 2020-12-13 DIAGNOSIS — C9202 Acute myeloblastic leukemia, in relapse: Secondary | ICD-10-CM | POA: Diagnosis not present

## 2020-12-13 DIAGNOSIS — D61818 Other pancytopenia: Secondary | ICD-10-CM | POA: Diagnosis not present

## 2020-12-13 DIAGNOSIS — R42 Dizziness and giddiness: Secondary | ICD-10-CM | POA: Diagnosis not present

## 2020-12-14 DIAGNOSIS — Z9481 Bone marrow transplant status: Secondary | ICD-10-CM | POA: Diagnosis not present

## 2020-12-14 DIAGNOSIS — C9202 Acute myeloblastic leukemia, in relapse: Secondary | ICD-10-CM | POA: Diagnosis not present

## 2020-12-14 DIAGNOSIS — Z5111 Encounter for antineoplastic chemotherapy: Secondary | ICD-10-CM | POA: Diagnosis not present

## 2020-12-15 DIAGNOSIS — C9202 Acute myeloblastic leukemia, in relapse: Secondary | ICD-10-CM | POA: Diagnosis not present

## 2020-12-15 DIAGNOSIS — Z5111 Encounter for antineoplastic chemotherapy: Secondary | ICD-10-CM | POA: Diagnosis not present

## 2020-12-15 DIAGNOSIS — Z9481 Bone marrow transplant status: Secondary | ICD-10-CM | POA: Diagnosis not present

## 2020-12-16 DIAGNOSIS — C9202 Acute myeloblastic leukemia, in relapse: Secondary | ICD-10-CM | POA: Diagnosis not present

## 2020-12-16 DIAGNOSIS — Z9481 Bone marrow transplant status: Secondary | ICD-10-CM | POA: Diagnosis not present

## 2020-12-16 DIAGNOSIS — Z5111 Encounter for antineoplastic chemotherapy: Secondary | ICD-10-CM | POA: Diagnosis not present

## 2020-12-17 DIAGNOSIS — Z5111 Encounter for antineoplastic chemotherapy: Secondary | ICD-10-CM | POA: Diagnosis not present

## 2020-12-17 DIAGNOSIS — Z9481 Bone marrow transplant status: Secondary | ICD-10-CM | POA: Diagnosis not present

## 2020-12-17 DIAGNOSIS — C9202 Acute myeloblastic leukemia, in relapse: Secondary | ICD-10-CM | POA: Diagnosis not present

## 2020-12-20 DIAGNOSIS — D709 Neutropenia, unspecified: Secondary | ICD-10-CM | POA: Diagnosis not present

## 2020-12-20 DIAGNOSIS — K5900 Constipation, unspecified: Secondary | ICD-10-CM | POA: Diagnosis not present

## 2020-12-20 DIAGNOSIS — R945 Abnormal results of liver function studies: Secondary | ICD-10-CM | POA: Diagnosis not present

## 2020-12-20 DIAGNOSIS — Z9484 Stem cells transplant status: Secondary | ICD-10-CM | POA: Diagnosis not present

## 2020-12-20 DIAGNOSIS — Z9481 Bone marrow transplant status: Secondary | ICD-10-CM | POA: Diagnosis not present

## 2020-12-20 DIAGNOSIS — C9202 Acute myeloblastic leukemia, in relapse: Secondary | ICD-10-CM | POA: Diagnosis not present

## 2020-12-23 DIAGNOSIS — Z9481 Bone marrow transplant status: Secondary | ICD-10-CM | POA: Diagnosis not present

## 2020-12-23 DIAGNOSIS — C9202 Acute myeloblastic leukemia, in relapse: Secondary | ICD-10-CM | POA: Diagnosis not present

## 2020-12-27 DIAGNOSIS — R7989 Other specified abnormal findings of blood chemistry: Secondary | ICD-10-CM | POA: Diagnosis not present

## 2020-12-27 DIAGNOSIS — Z79899 Other long term (current) drug therapy: Secondary | ICD-10-CM | POA: Diagnosis not present

## 2020-12-27 DIAGNOSIS — Z792 Long term (current) use of antibiotics: Secondary | ICD-10-CM | POA: Diagnosis not present

## 2020-12-27 DIAGNOSIS — R42 Dizziness and giddiness: Secondary | ICD-10-CM | POA: Diagnosis not present

## 2020-12-27 DIAGNOSIS — R6881 Early satiety: Secondary | ICD-10-CM | POA: Diagnosis not present

## 2020-12-27 DIAGNOSIS — C9202 Acute myeloblastic leukemia, in relapse: Secondary | ICD-10-CM | POA: Diagnosis not present

## 2020-12-27 DIAGNOSIS — Z9481 Bone marrow transplant status: Secondary | ICD-10-CM | POA: Diagnosis not present

## 2020-12-27 DIAGNOSIS — K068 Other specified disorders of gingiva and edentulous alveolar ridge: Secondary | ICD-10-CM | POA: Diagnosis not present

## 2020-12-27 DIAGNOSIS — Z9484 Stem cells transplant status: Secondary | ICD-10-CM | POA: Diagnosis not present

## 2020-12-27 DIAGNOSIS — R112 Nausea with vomiting, unspecified: Secondary | ICD-10-CM | POA: Diagnosis not present

## 2020-12-30 DIAGNOSIS — Z9481 Bone marrow transplant status: Secondary | ICD-10-CM | POA: Diagnosis not present

## 2020-12-30 DIAGNOSIS — C9202 Acute myeloblastic leukemia, in relapse: Secondary | ICD-10-CM | POA: Diagnosis not present

## 2021-01-03 DIAGNOSIS — Z792 Long term (current) use of antibiotics: Secondary | ICD-10-CM | POA: Diagnosis not present

## 2021-01-03 DIAGNOSIS — Z9481 Bone marrow transplant status: Secondary | ICD-10-CM | POA: Diagnosis not present

## 2021-01-03 DIAGNOSIS — Z79899 Other long term (current) drug therapy: Secondary | ICD-10-CM | POA: Diagnosis not present

## 2021-01-03 DIAGNOSIS — R945 Abnormal results of liver function studies: Secondary | ICD-10-CM | POA: Diagnosis not present

## 2021-01-03 DIAGNOSIS — C9202 Acute myeloblastic leukemia, in relapse: Secondary | ICD-10-CM | POA: Diagnosis not present

## 2021-01-06 DIAGNOSIS — Z9481 Bone marrow transplant status: Secondary | ICD-10-CM | POA: Diagnosis not present

## 2021-01-06 DIAGNOSIS — R791 Abnormal coagulation profile: Secondary | ICD-10-CM | POA: Diagnosis not present

## 2021-01-06 DIAGNOSIS — C9202 Acute myeloblastic leukemia, in relapse: Secondary | ICD-10-CM | POA: Diagnosis not present

## 2021-01-07 DIAGNOSIS — Z9481 Bone marrow transplant status: Secondary | ICD-10-CM | POA: Diagnosis not present

## 2021-01-07 DIAGNOSIS — C9202 Acute myeloblastic leukemia, in relapse: Secondary | ICD-10-CM | POA: Diagnosis not present

## 2021-02-08 DEATH — deceased
